# Patient Record
Sex: Male | Born: 1970 | Race: White | Hispanic: No | Marital: Married | Smoking: Never smoker
Health system: Southern US, Community
[De-identification: ages and names within clinical notes are randomized; demographics above are authoritative.]

## PROBLEM LIST (undated history)

## (undated) DIAGNOSIS — M20019 Mallet finger of unspecified finger(s): Secondary | ICD-10-CM

## (undated) DIAGNOSIS — M25519 Pain in unspecified shoulder: Secondary | ICD-10-CM

## (undated) DIAGNOSIS — R197 Diarrhea, unspecified: Secondary | ICD-10-CM

## (undated) DIAGNOSIS — R748 Abnormal levels of other serum enzymes: Secondary | ICD-10-CM

## (undated) DIAGNOSIS — R05 Cough: Secondary | ICD-10-CM

## (undated) DIAGNOSIS — M25529 Pain in unspecified elbow: Secondary | ICD-10-CM

## (undated) DIAGNOSIS — F1010 Alcohol abuse, uncomplicated: Secondary | ICD-10-CM

## (undated) DIAGNOSIS — R61 Generalized hyperhidrosis: Secondary | ICD-10-CM

## (undated) DIAGNOSIS — M7710 Lateral epicondylitis, unspecified elbow: Secondary | ICD-10-CM

## (undated) DIAGNOSIS — R109 Unspecified abdominal pain: Secondary | ICD-10-CM

## (undated) DIAGNOSIS — S83207A Unspecified tear of unspecified meniscus, current injury, left knee, initial encounter: Secondary | ICD-10-CM

## (undated) DIAGNOSIS — M25562 Pain in left knee: Secondary | ICD-10-CM

## (undated) DIAGNOSIS — K219 Gastro-esophageal reflux disease without esophagitis: Secondary | ICD-10-CM

## (undated) DIAGNOSIS — R079 Chest pain, unspecified: Secondary | ICD-10-CM

## (undated) DIAGNOSIS — J069 Acute upper respiratory infection, unspecified: Secondary | ICD-10-CM

## (undated) DIAGNOSIS — R059 Cough, unspecified: Secondary | ICD-10-CM

## (undated) DIAGNOSIS — M79662 Pain in left lower leg: Secondary | ICD-10-CM

## (undated) DIAGNOSIS — J329 Chronic sinusitis, unspecified: Secondary | ICD-10-CM

## (undated) HISTORY — DX: Unspecified tear of unspecified meniscus, current injury, left knee, initial encounter: S83.207A

## (undated) HISTORY — PX: APPENDECTOMY: SHX54

## (undated) HISTORY — DX: Displaced fracture of distal phalanx of unspecified finger, initial encounter for closed fracture: M20.019

## (undated) HISTORY — DX: Gastro-esophageal reflux disease without esophagitis: K21.9

## (undated) HISTORY — DX: Diarrhea, unspecified: R19.7

## (undated) HISTORY — DX: Abnormal levels of other serum enzymes: R74.8

## (undated) HISTORY — DX: Cough, unspecified: R05.9

## (undated) HISTORY — DX: Lateral epicondylitis, unspecified elbow: M77.10

## (undated) HISTORY — DX: Pain in left lower leg: M79.662

## (undated) HISTORY — DX: Pain in unspecified elbow: M25.529

## (undated) HISTORY — DX: Acute upper respiratory infection, unspecified: J06.9

## (undated) HISTORY — DX: Chest pain, unspecified: R07.9

## (undated) HISTORY — DX: Chronic sinusitis, unspecified: J32.9

## (undated) HISTORY — PX: KNEE SURGERY: SHX244

## (undated) HISTORY — PX: PILONIDAL CYST DRAINAGE: SHX743

## (undated) HISTORY — DX: Unspecified abdominal pain: R10.9

## (undated) HISTORY — DX: Pain in unspecified shoulder: M25.519

## (undated) HISTORY — DX: Generalized hyperhidrosis: R61

## (undated) HISTORY — DX: Pain in left knee: M25.562

---
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
## 2009-01-04 ENCOUNTER — Inpatient Hospital Stay (HOSPITAL_COMMUNITY): Admission: EM | Admit: 2009-01-04 | Discharge: 2009-01-10 | Payer: Self-pay | Admitting: Emergency Medicine

## 2009-01-04 ENCOUNTER — Encounter (INDEPENDENT_AMBULATORY_CARE_PROVIDER_SITE_OTHER): Payer: Self-pay | Admitting: General Surgery

## 2010-03-26 LAB — CBC
HCT: 32 % — ABNORMAL LOW (ref 39.0–52.0)
Hemoglobin: 11.1 g/dL — ABNORMAL LOW (ref 13.0–17.0)
MCHC: 34.6 g/dL (ref 30.0–36.0)
MCHC: 34.8 g/dL (ref 30.0–36.0)
MCHC: 35.2 g/dL (ref 30.0–36.0)
MCV: 96 fL (ref 78.0–100.0)
Platelets: 223 10*3/uL (ref 150–400)
RBC: 3.28 MIL/uL — ABNORMAL LOW (ref 4.22–5.81)
RBC: 3.3 MIL/uL — ABNORMAL LOW (ref 4.22–5.81)
RDW: 12.7 % (ref 11.5–15.5)
WBC: 8 10*3/uL (ref 4.0–10.5)

## 2010-03-26 LAB — BASIC METABOLIC PANEL
BUN: 8 mg/dL (ref 6–23)
CO2: 27 mEq/L (ref 19–32)
Calcium: 8.4 mg/dL (ref 8.4–10.5)
Calcium: 8.7 mg/dL (ref 8.4–10.5)
Chloride: 97 mEq/L (ref 96–112)
Creatinine, Ser: 0.82 mg/dL (ref 0.4–1.5)
GFR calc non Af Amer: >60 mL/min (ref >60)
GFR calc non Af Amer: >60 mL/min (ref >60)
GFR calc non Af Amer: >60 mL/min (ref >60)
Glucose, Bld: 103 mg/dL — ABNORMAL HIGH (ref 70–99)
Glucose, Bld: 115 mg/dL — ABNORMAL HIGH (ref 70–99)
Glucose, Bld: 116 mg/dL — ABNORMAL HIGH (ref 70–99)
Potassium: 3.1 mEq/L — ABNORMAL LOW (ref 3.5–5.1)
Potassium: 3.3 mEq/L — ABNORMAL LOW (ref 3.5–5.1)
Sodium: 135 mEq/L (ref 135–145)
Sodium: 136 mEq/L (ref 135–145)

## 2010-04-10 LAB — CBC
HCT: 34.9 % — ABNORMAL LOW (ref 39.0–52.0)
HCT: 37.1 % — ABNORMAL LOW (ref 39.0–52.0)
Hemoglobin: 12.3 g/dL — ABNORMAL LOW (ref 13.0–17.0)
Hemoglobin: 13.4 g/dL (ref 13.0–17.0)
MCHC: 34.3 g/dL (ref 30.0–36.0)
MCHC: 35.3 g/dL (ref 30.0–36.0)
MCV: 96.6 fL (ref 78.0–100.0)
MCV: 96.8 fL (ref 78.0–100.0)
MCV: 97.4 fL (ref 78.0–100.0)
Platelets: 179 10*3/uL (ref 150–400)
Platelets: 198 10*3/uL (ref 150–400)
RBC: 3.61 MIL/uL — ABNORMAL LOW (ref 4.22–5.81)
RBC: 3.93 MIL/uL — ABNORMAL LOW (ref 4.22–5.81)
WBC: 10.9 10*3/uL — ABNORMAL HIGH (ref 4.0–10.5)
WBC: 11.8 10*3/uL — ABNORMAL HIGH (ref 4.0–10.5)

## 2010-04-10 LAB — BASIC METABOLIC PANEL
BUN: 10 mg/dL (ref 6–23)
CO2: 29 mEq/L (ref 19–32)
CO2: 29 mEq/L (ref 19–32)
Calcium: 8.3 mg/dL — ABNORMAL LOW (ref 8.4–10.5)
Chloride: 96 mEq/L (ref 96–112)
Chloride: 98 mEq/L (ref 96–112)
Creatinine, Ser: 0.81 mg/dL (ref 0.4–1.5)
Creatinine, Ser: 0.81 mg/dL (ref 0.4–1.5)
Creatinine, Ser: 0.86 mg/dL (ref 0.4–1.5)
Glucose, Bld: 106 mg/dL — ABNORMAL HIGH (ref 70–99)
Potassium: 3.9 mEq/L (ref 3.5–5.1)

## 2010-04-10 LAB — COMPREHENSIVE METABOLIC PANEL
BUN: 10 mg/dL (ref 6–23)
Calcium: 9.1 mg/dL (ref 8.4–10.5)
Glucose, Bld: 97 mg/dL (ref 70–99)
Total Protein: 7.1 g/dL (ref 6.0–8.3)

## 2010-04-10 LAB — URINALYSIS, ROUTINE W REFLEX MICROSCOPIC
Ketones, ur: NEGATIVE mg/dL
Nitrite: NEGATIVE
Specific Gravity, Urine: 1.017 (ref 1.005–1.030)
pH: 6 (ref 5.0–8.0)

## 2010-04-10 LAB — DIFFERENTIAL
Lymphocytes Relative: 11 % — ABNORMAL LOW (ref 12–46)
Lymphs Abs: 1.2 10*3/uL (ref 0.7–4.0)
Neutro Abs: 9.1 10*3/uL — ABNORMAL HIGH (ref 1.7–7.7)
Neutrophils Relative %: 83 % — ABNORMAL HIGH (ref 43–77)

## 2010-04-10 LAB — LIPASE, BLOOD: Lipase: 20 U/L (ref 11–59)

## 2017-02-05 ENCOUNTER — Encounter (HOSPITAL_COMMUNITY): Payer: Self-pay

## 2017-02-05 ENCOUNTER — Other Ambulatory Visit: Payer: Self-pay

## 2017-02-05 ENCOUNTER — Emergency Department (HOSPITAL_COMMUNITY)
Admission: EM | Admit: 2017-02-05 | Discharge: 2017-02-05 | Disposition: A | Discharge status: Home / Self Care | Payer: Self-pay | Attending: Emergency Medicine | Admitting: Emergency Medicine

## 2017-02-05 DIAGNOSIS — F10930 Alcohol use, unspecified with withdrawal, uncomplicated: Secondary | ICD-10-CM

## 2017-02-05 DIAGNOSIS — F10230 Alcohol dependence with withdrawal, uncomplicated: Secondary | ICD-10-CM | POA: Insufficient documentation

## 2017-02-05 DIAGNOSIS — F17290 Nicotine dependence, other tobacco product, uncomplicated: Secondary | ICD-10-CM | POA: Insufficient documentation

## 2017-02-05 HISTORY — DX: Alcohol abuse, uncomplicated: F10.10

## 2017-02-05 MED ORDER — CHLORDIAZEPOXIDE HCL 25 MG PO CAPS: ORAL_CAPSULE | ORAL | 0 refills | Status: DC

## 2017-02-05 MED ORDER — LORAZEPAM 1 MG PO TABS
2.0000 mg | ORAL_TABLET | Freq: Once | ORAL | Status: AC
  Administered 2017-02-05: 2 mg via ORAL
  Filled 2017-02-05: qty 2

## 2017-02-05 NOTE — ED Triage Notes (Signed)
Patient reports that he has not drank any alcohol in 14 hours. Patient states that he drinks 12 beers daily. Patient also c/o tremors and diaphoresis.

## 2017-02-05 NOTE — ED Provider Notes (Signed)
[facility]-EMERGENCY DEPT Provider Note   CSN: [id number] Arrival date & time: 02/05/17  1336     History   Chief Complaint Chief Complaint  Patient presents with  . detox alcohol    HPI Robert Norton is a 47 y.o. male.  47 y/o male w/ request for etoh detox  Last drink 16 hrs ago patient states that he normally drinks about 12 beers a day.  Denies any illicit drug use.  No suicidal or homicidal ideations.  Has had some tremors but denies any seizure activity.  No hallucinations.  Denies any abdominal pain or vomiting.  No treatment used prior to arrival.      Past Medical History:  Diagnosis Date  . ETOH abuse     There are no active problems to display for this patient.   Past Surgical History:  Procedure Laterality Date  . APPENDECTOMY    . KNEE SURGERY    . PILONIDAL CYST DRAINAGE         Home Medications    Prior to Admission medications   Not on File    Family History History reviewed. No pertinent family history.  Social History Social History   Tobacco Use  . Smoking status: Never Smoker  . Smokeless tobacco: Current User    Types: Snuff  Substance Use Topics  . Alcohol use: Yes    Alcohol/week: 7.2 oz    Types: 12 Cans of beer per week    Comment: daily  . Drug use: No     Allergies   Penicillins   Review of Systems Review of Systems  All other systems reviewed and are negative.    Physical Exam Updated Vital Signs BP (!) 134/108 (BP Location: Left Arm)   Pulse 89   Temp 99.1 F (37.3 C) (Oral)   Resp 20   Ht 1.88 m (6\' 2" )   Wt 89.4 kg (197 lb)   SpO2 98%   BMI 25.29 kg/m   Physical Exam  Constitutional: He is oriented to person, place, and time. He appears well-developed and well-nourished.  Non-toxic appearance. No distress.  HENT:  Head: Normocephalic and atraumatic.  Eyes: Conjunctivae, EOM and lids are normal. Pupils are equal, round, and reactive to light.  Neck: Normal range of  motion. Neck supple. No tracheal deviation present. No thyroid mass present.  Cardiovascular: Normal rate, regular rhythm and normal heart sounds. Exam reveals no gallop.  No murmur heard. Pulmonary/Chest: Effort normal and breath sounds normal. No stridor. No respiratory distress. He has no decreased breath sounds. He has no wheezes. He has no rhonchi. He has no rales.  Abdominal: Soft. Normal appearance and bowel sounds are normal. He exhibits no distension. There is no tenderness. There is no rebound and no CVA tenderness.  Musculoskeletal: Normal range of motion. He exhibits no edema or tenderness.  Neurological: He is alert and oriented to person, place, and time. He has normal strength. No cranial nerve deficit or sensory deficit. GCS eye subscore is 4. GCS verbal subscore is 5. GCS motor subscore is 6.  Skin: Skin is warm and dry. No abrasion and no rash noted.  Psychiatric: He has a normal mood and affect. His speech is normal and behavior is normal. He expresses no suicidal plans and no homicidal plans.  Nursing note and vitals reviewed.    ED Treatments / Results  Labs (all labs ordered are listed, but only abnormal results are displayed) Labs Reviewed - No data to display  EKG  EKG Interpretation None       Radiology No results found.  Procedures Procedures (including critical care time)  Medications Ordered in ED Medications  LORazepam (ATIVAN) tablet 2 mg (not administered)     Initial Impression / Assessment and Plan / ED Course  I have reviewed the triage vital signs and the nursing notes.  Pertinent labs & imaging results that were available during my care of the patient were reviewed by me and considered in my medical decision making (see chart for details).    Patient with symptoms of mild alcohol withdrawal.  Medicated with Ativan 2 mg p.o. here.  Will give Librium taper as well as outpatient resources.  Final Clinical Impressions(s) / ED Diagnoses    Final diagnoses:  None    ED Discharge Orders    None       Lorre Nick, MD 02/05/17 1406

## 2017-02-21 ENCOUNTER — Encounter (HOSPITAL_COMMUNITY): Payer: Self-pay

## 2017-02-21 ENCOUNTER — Emergency Department (HOSPITAL_COMMUNITY)
Admission: EM | Admit: 2017-02-21 | Discharge: 2017-02-21 | Disposition: A | Discharge status: Home / Self Care | Payer: Self-pay | Attending: Emergency Medicine | Admitting: Emergency Medicine

## 2017-02-21 DIAGNOSIS — F10930 Alcohol use, unspecified with withdrawal, uncomplicated: Secondary | ICD-10-CM

## 2017-02-21 DIAGNOSIS — K7010 Alcoholic hepatitis without ascites: Secondary | ICD-10-CM | POA: Insufficient documentation

## 2017-02-21 DIAGNOSIS — F10230 Alcohol dependence with withdrawal, uncomplicated: Secondary | ICD-10-CM | POA: Insufficient documentation

## 2017-02-21 DIAGNOSIS — F17290 Nicotine dependence, other tobacco product, uncomplicated: Secondary | ICD-10-CM | POA: Insufficient documentation

## 2017-02-21 LAB — CBC
HCT: 44.9 % (ref 39.0–52.0)
HEMOGLOBIN: 16.1 g/dL (ref 13.0–17.0)
MCH: 36 pg — AB (ref 26.0–34.0)
MCHC: 35.9 g/dL (ref 30.0–36.0)
MCV: 100.4 fL — ABNORMAL HIGH (ref 78.0–100.0)
PLATELETS: 194 10*3/uL (ref 150–400)
RBC: 4.47 MIL/uL (ref 4.22–5.81)
RDW: 12.8 % (ref 11.5–15.5)
WBC: 6.5 10*3/uL (ref 4.0–10.5)

## 2017-02-21 LAB — COMPREHENSIVE METABOLIC PANEL
ALBUMIN: 4.5 g/dL (ref 3.5–5.0)
ALK PHOS: 49 U/L (ref 38–126)
ALT: 42 U/L (ref 17–63)
ANION GAP: 17 — AB (ref 5–15)
AST: 57 U/L — ABNORMAL HIGH (ref 15–41)
BILIRUBIN TOTAL: 2.8 mg/dL — AB (ref 0.3–1.2)
BUN: 12 mg/dL (ref 6–20)
CALCIUM: 9.3 mg/dL (ref 8.9–10.3)
CO2: 23 mmol/L (ref 22–32)
Chloride: 98 mmol/L — ABNORMAL LOW (ref 101–111)
Creatinine, Ser: 1 mg/dL (ref 0.61–1.24)
GFR calc Af Amer: >60 mL/min (ref >60)
GLUCOSE: 102 mg/dL — AB (ref 65–99)
POTASSIUM: 4.3 mmol/L (ref 3.5–5.1)
Sodium: 138 mmol/L (ref 135–145)
TOTAL PROTEIN: 7 g/dL (ref 6.5–8.1)

## 2017-02-21 LAB — RAPID URINE DRUG SCREEN, HOSP PERFORMED
Amphetamines: NOT DETECTED
Barbiturates: NOT DETECTED
Benzodiazepines: POSITIVE — AB
COCAINE: NOT DETECTED
OPIATES: NOT DETECTED
Tetrahydrocannabinol: NOT DETECTED

## 2017-02-21 LAB — ETHANOL

## 2017-02-21 MED ORDER — ADULT MULTIVITAMIN W/MINERALS CH: 1.0000 | ORAL_TABLET | Freq: Every day | ORAL | Status: DC

## 2017-02-21 MED ORDER — ONDANSETRON 4 MG PO TBDP: 4.0000 mg | ORAL_TABLET | Freq: Four times a day (QID) | ORAL | Status: DC | PRN

## 2017-02-21 MED ORDER — CHLORDIAZEPOXIDE HCL 25 MG PO CAPS: ORAL_CAPSULE | ORAL | 0 refills | Status: DC

## 2017-02-21 MED ORDER — VITAMIN B-1 100 MG PO TABS: 100.0000 mg | ORAL_TABLET | Freq: Every day | ORAL | Status: DC

## 2017-02-21 MED ORDER — LOPERAMIDE HCL 2 MG PO CAPS: 2.0000 mg | ORAL_CAPSULE | ORAL | Status: DC | PRN

## 2017-02-21 MED ORDER — CHLORDIAZEPOXIDE HCL 25 MG PO CAPS
25.0000 mg | ORAL_CAPSULE | Freq: Four times a day (QID) | ORAL | Status: DC | PRN
  Administered 2017-02-21: 25 mg via ORAL
  Filled 2017-02-21: qty 1

## 2017-02-21 MED ORDER — THIAMINE HCL 100 MG/ML IJ SOLN: 100.0000 mg | Freq: Once | INTRAMUSCULAR | Status: DC

## 2017-02-21 MED ORDER — HYDROXYZINE HCL 25 MG PO TABS: 25.0000 mg | ORAL_TABLET | Freq: Four times a day (QID) | ORAL | Status: DC | PRN

## 2017-02-21 NOTE — Discharge Instructions (Signed)
Avoid all forms of alcohol.  Your alcohol use has caused inflammation of your liver, which will improve, when you stop drinking alcohol.  Consider counseling, or treatment if needed for ongoing alcohol abuse.  May be also helpful to go to Alcoholics Anonymous

## 2017-02-21 NOTE — ED Notes (Signed)
Pt states he has to pick up his kids at 4 and must leave dr Effie ShyWentz into see pt

## 2017-02-21 NOTE — ED Provider Notes (Signed)
[facility] Provider Note   CSN: [id number] Arrival date & time: 02/21/17  [id number]     History   Chief Complaint Chief Complaint  Patient presents with  . ETOH withdrawal/tremors    HPI Robert Norton is a 47 y.o. male.  He is here because of tremulousness, associated with alcohol avoidance in the last 24 hours.  Similar problems 2 weeks ago evaluated and treated at the ED, with short-term Librium taper, with success.  Unfortunately he resumed drinking after that.  He is committed to avoidance of alcohol, because of going through a divorce, and planning to have his children with him this weekend.  He denies headache, chest pain, weakness or dizziness.  There are no other known modifying factors.  HPI  Past Medical History:  Diagnosis Date  . ETOH abuse     There are no active problems to display for this patient.   Past Surgical History:  Procedure Laterality Date  . APPENDECTOMY    . KNEE SURGERY    . PILONIDAL CYST DRAINAGE         Home Medications    Prior to Admission medications   Medication Sig Start Date End Date Taking? Authorizing Provider  chlordiazePOXIDE (LIBRIUM) 25 MG capsule 50mg  PO TID x 1D, then 25-50mg  PO BID X 1D, then 25-50mg  PO QD prn 02/21/17   Mancel BaleWentz, Serena Petterson, MD    Family History Family History  Problem Relation Age of Onset  . Diabetes Mother     Social History Social History   Tobacco Use  . Smoking status: Never Smoker  . Smokeless tobacco: Current User    Types: Snuff  Substance Use Topics  . Alcohol use: Yes    Alcohol/week: 7.2 oz    Types: 12 Cans of beer per week    Comment: daily  . Drug use: No     Allergies   Penicillins   Review of Systems Review of Systems  All other systems reviewed and are negative.    Physical Exam Updated Vital Signs BP (!) 155/90 (BP Location: Left Arm)   Pulse 73   Temp 98.2 F (36.8 C) (Oral)   Resp 16   SpO2 98%   Physical Exam    Constitutional: He is oriented to person, place, and time. He appears well-developed and well-nourished. No distress.  HENT:  Head: Normocephalic and atraumatic.  Right Ear: External ear normal.  Left Ear: External ear normal.  Eyes: Conjunctivae and EOM are normal. Pupils are equal, round, and reactive to light.  Neck: Normal range of motion and phonation normal. Neck supple.  Cardiovascular: Normal rate, regular rhythm and normal heart sounds.  Pulmonary/Chest: Effort normal and breath sounds normal. He exhibits no bony tenderness.  Abdominal: Soft. There is no tenderness.  Musculoskeletal: Normal range of motion.  Neurological: He is alert and oriented to person, place, and time. No cranial nerve deficit or sensory deficit. He exhibits normal muscle tone. Coordination normal.  No dysarthria or aphasia.  Normal gait.  Skin: Skin is warm, dry and intact.  Psychiatric: He has a normal mood and affect. His behavior is normal. Judgment and thought content normal.  Nursing note and vitals reviewed.    ED Treatments / Results  Labs (all labs ordered are listed, but only abnormal results are displayed) Labs Reviewed  COMPREHENSIVE METABOLIC PANEL - Abnormal; Notable for the following components:      Result Value   Chloride 98 (*)    Glucose, Bld  102 (*)    AST 57 (*)    Total Bilirubin 2.8 (*)    Anion gap 17 (*)    All other components within normal limits  CBC - Abnormal; Notable for the following components:   MCV 100.4 (*)    MCH 36.0 (*)    All other components within normal limits  RAPID URINE DRUG SCREEN, HOSP PERFORMED - Abnormal; Notable for the following components:   Benzodiazepines POSITIVE (*)    All other components within normal limits  ETHANOL    EKG  EKG Interpretation None       Radiology No results found.  Procedures Procedures (including critical care time)  Medications Ordered in ED Medications  thiamine (B-1) injection 100 mg (not  administered)  thiamine (VITAMIN B-1) tablet 100 mg (not administered)  multivitamin with minerals tablet 1 tablet (not administered)  chlordiazePOXIDE (LIBRIUM) capsule 25 mg (25 mg Oral Given 02/21/17 1506)  hydrOXYzine (ATARAX/VISTARIL) tablet 25 mg (not administered)  loperamide (IMODIUM) capsule 2-4 mg (not administered)  ondansetron (ZOFRAN-ODT) disintegrating tablet 4 mg (not administered)     Initial Impression / Assessment and Plan / ED Course  I have reviewed the triage vital signs and the nursing notes.  Pertinent labs & imaging results that were available during my care of the patient were reviewed by me and considered in my medical decision making (see chart for details).      Patient Vitals for the past 24 hrs:  BP Temp Temp src Pulse Resp SpO2  02/21/17 1200 (!) 155/90 98.2 F (36.8 C) Oral 73 16 98 %  02/21/17 0955 (!) 163/110 98 F (36.7 C) Oral 76 16 100 %    3:07 PM Reevaluation with update and discussion. After initial assessment and treatment, an updated evaluation reveals clinical exam unchanged, he is calm and comfortable.  Findings discussed and questions answered. Mancel Bale      Final Clinical Impressions(s) / ED Diagnoses   Final diagnoses:  Alcohol withdrawal syndrome without complication (HCC)  Alcoholic hepatitis without ascites   Ongoing alcohol abuse with alcohol withdrawal symptoms.  Patient stable foot outpatient management.  Mild blood pressure elevation likely related to alcohol withdrawal.  Screening labs, urine drug screen, metabolic panel and CBC are reassuring.  Mild elevation of T bilirubin, likely related to alcohol associated hepatitis.  Nonspecific anion gap elevation at 17.  Nursing Notes Reviewed/ Care Coordinated Applicable Imaging Reviewed Interpretation of Laboratory Data incorporated into ED treatment  The patient appears reasonably screened and/or stabilized for discharge and I doubt any other medical condition or other  [facility] requiring further screening, evaluation, or treatment in the ED at this time prior to discharge.  Plan: Home Medications-DC analgesia.; Home Treatments-rest, fluids, avoid alcohol; return here if the recommended treatment, does not improve the symptoms; Recommended follow up-outpatient follow-up with alcohol treatment facility and/or support service as needed   ED Discharge Orders        Ordered    chlordiazePOXIDE (LIBRIUM) 25 MG capsule     02/21/17 1500       Mancel Bale, MD 02/21/17 [phone]

## 2017-02-21 NOTE — ED Triage Notes (Signed)
Patient complains of tremors and restlessness since last evening. States that he is going through a divorce and has been drinking daily x 2-3 weeks, last use last night 6pm. Tremors noted on arrival, no GI symptoms. Alert and oriented, denies drug use

## 2017-02-21 NOTE — ED Notes (Addendum)
Pt given gingerale and paces back and forth by bed

## 2017-02-21 NOTE — ED Notes (Signed)
Pt states he has been going thru divorce that for 3 weeks in row he has had something to drink etoh wise everyday, may have been just a beer or 2 somethimes more , last night he had bad sweats , tremors that cont and  Has had  Anxiety out the roof, states he gets his kids this weekend and knew he had to do something

## 2018-04-02 ENCOUNTER — Encounter (HOSPITAL_COMMUNITY): Payer: Self-pay | Admitting: Emergency Medicine

## 2018-04-02 ENCOUNTER — Emergency Department (HOSPITAL_COMMUNITY)
Admission: EM | Admit: 2018-04-02 | Discharge: 2018-04-02 | Disposition: A | Discharge status: Home / Self Care | Payer: BLUE CROSS/BLUE SHIELD | Attending: Emergency Medicine | Admitting: Emergency Medicine

## 2018-04-02 ENCOUNTER — Emergency Department (HOSPITAL_COMMUNITY): Payer: BLUE CROSS/BLUE SHIELD

## 2018-04-02 DIAGNOSIS — R404 Transient alteration of awareness: Secondary | ICD-10-CM | POA: Diagnosis not present

## 2018-04-02 DIAGNOSIS — G40909 Epilepsy, unspecified, not intractable, without status epilepticus: Secondary | ICD-10-CM | POA: Diagnosis not present

## 2018-04-02 DIAGNOSIS — R569 Unspecified convulsions: Secondary | ICD-10-CM

## 2018-04-02 DIAGNOSIS — R0689 Other abnormalities of breathing: Secondary | ICD-10-CM | POA: Diagnosis not present

## 2018-04-02 DIAGNOSIS — R064 Hyperventilation: Secondary | ICD-10-CM | POA: Diagnosis not present

## 2018-04-02 DIAGNOSIS — R55 Syncope and collapse: Secondary | ICD-10-CM

## 2018-04-02 LAB — COMPREHENSIVE METABOLIC PANEL
ALT: 171 U/L — ABNORMAL HIGH (ref 0–44)
ANION GAP: 20 — AB (ref 5–15)
AST: 192 U/L — ABNORMAL HIGH (ref 15–41)
Albumin: 4.5 g/dL (ref 3.5–5.0)
Alkaline Phosphatase: 49 U/L (ref 38–126)
BUN: 10 mg/dL (ref 6–20)
CO2: 17 mmol/L — AB (ref 22–32)
Calcium: 9.4 mg/dL (ref 8.9–10.3)
Chloride: 98 mmol/L (ref 98–111)
Creatinine, Ser: 0.95 mg/dL (ref 0.61–1.24)
GFR calc non Af Amer: >60 mL/min (ref >60)
GLUCOSE: 129 mg/dL — AB (ref 70–99)
POTASSIUM: 3.8 mmol/L (ref 3.5–5.1)
Sodium: 135 mmol/L (ref 135–145)
TOTAL PROTEIN: 7.3 g/dL (ref 6.5–8.1)
Total Bilirubin: 1.3 mg/dL — ABNORMAL HIGH (ref 0.3–1.2)

## 2018-04-02 LAB — CBC WITH DIFFERENTIAL/PLATELET
Abs Immature Granulocytes: 0.06 10*3/uL (ref 0.00–0.07)
BASOS ABS: 0 10*3/uL (ref 0.0–0.1)
Basophils Relative: 1 %
EOS ABS: 0.1 10*3/uL (ref 0.0–0.5)
Eosinophils Relative: 1 %
HEMATOCRIT: 44 % (ref 39.0–52.0)
Hemoglobin: 14.8 g/dL (ref 13.0–17.0)
IMMATURE GRANULOCYTES: 1 %
LYMPHS ABS: 1.7 10*3/uL (ref 0.7–4.0)
LYMPHS PCT: 21 %
MCH: 34.4 pg — ABNORMAL HIGH (ref 26.0–34.0)
MCHC: 33.6 g/dL (ref 30.0–36.0)
MCV: 102.3 fL — ABNORMAL HIGH (ref 80.0–100.0)
Monocytes Absolute: 0.8 10*3/uL (ref 0.1–1.0)
Monocytes Relative: 10 %
NEUTROS PCT: 66 %
NRBC: 0 % (ref 0.0–0.2)
Neutro Abs: 5.3 10*3/uL (ref 1.7–7.7)
Platelets: 155 10*3/uL (ref 150–400)
RBC: 4.3 MIL/uL (ref 4.22–5.81)
RDW: 12.1 % (ref 11.5–15.5)
WBC: 7.9 10*3/uL (ref 4.0–10.5)

## 2018-04-02 LAB — RAPID URINE DRUG SCREEN, HOSP PERFORMED
Amphetamines: NOT DETECTED
BARBITURATES: NOT DETECTED
BENZODIAZEPINES: NOT DETECTED
COCAINE: NOT DETECTED
Opiates: NOT DETECTED
Tetrahydrocannabinol: NOT DETECTED

## 2018-04-02 LAB — ETHANOL: Alcohol, Ethyl (B): <10 mg/dL (ref <10)

## 2018-04-02 NOTE — ED Provider Notes (Signed)
[facility] EMERGENCY DEPARTMENT Provider Note   CSN: [id number] Arrival date & time: 04/02/18  1144    History   Chief Complaint Chief Complaint  Patient presents with  . Seizures    HPI Robert Norton is a 48 y.o. male.    Level 5 caveat due to altered mental status. HPI Patient presents after a seizure.  Patient was reportedly out on a walk with his girlfriend.  Lay down and had reported seizure.  Has not previously had a seizure history.  Had however been a heavy drinker.  States he no longer drinks as heavily but will have a couple drinks about every other day.  No longer drinking until he gets drunk.  No headache.  Still confused to what happened and patient states his tongue hurts from where he bit it.  Patient was diaphoretic and hypertensive initially. Past Medical History:  Diagnosis Date  . ETOH abuse     There are no active problems to display for this patient.   Past Surgical History:  Procedure Laterality Date  . APPENDECTOMY    . KNEE SURGERY    . PILONIDAL CYST DRAINAGE          Home Medications    Prior to Admission medications   Medication Sig Start Date End Date Taking? Authorizing Provider  chlordiazePOXIDE (LIBRIUM) 25 MG capsule 50mg  PO TID x 1D, then 25-50mg  PO BID X 1D, then 25-50mg  PO QD prn 02/21/17   Mancel Bale, MD    Family History Family History  Problem Relation Age of Onset  . Diabetes Mother     Social History Social History   Tobacco Use  . Smoking status: Never Smoker  . Smokeless tobacco: Current User    Types: Snuff  Substance Use Topics  . Alcohol use: Yes    Comment: former alcoholic; drinks occ  . Drug use: No     Allergies   Penicillins   Review of Systems Review of Systems  Cardiovascular: Negative for chest pain.  Neurological: Positive for seizures.  Psychiatric/Behavioral: Positive for confusion.     Physical Exam Updated Vital Signs BP 136/86   Pulse 79   Temp 97.7 F  (36.5 C) (Oral)   Resp 17   Ht 6\' 3"  (1.905 m)   Wt 86.2 kg   SpO2 99%   BMI 23.75 kg/m   Physical Exam Constitutional:      Appearance: Normal appearance. He is diaphoretic.  HENT:     Mouth/Throat:     Comments: Patient has bites on left side of tongue. Eyes:     Pupils: Pupils are equal, round, and reactive to light.  Neck:     Musculoskeletal: Neck supple.  Cardiovascular:     Rate and Rhythm: Regular rhythm.  Pulmonary:     Effort: Pulmonary effort is normal.  Chest:     Chest wall: No tenderness.  Abdominal:     Tenderness: There is no abdominal tenderness.  Musculoskeletal:     Right lower leg: No edema.     Left lower leg: No edema.  Skin:    General: Skin is warm.     Capillary Refill: Capillary refill takes less than 2 seconds.  Neurological:     Cranial Nerves: No cranial nerve deficit.     Comments: Patient is awake and alert but still somewhat confused to events and somewhat to what is been happening recently.      ED Treatments / Results  Labs (all labs  ordered are listed, but only abnormal results are displayed) Labs Reviewed  COMPREHENSIVE METABOLIC PANEL - Abnormal; Notable for the following components:      Result Value   CO2 17 (*)    Glucose, Bld 129 (*)    AST 192 (*)    ALT 171 (*)    Total Bilirubin 1.3 (*)    Anion gap 20 (*)    All other components within normal limits  CBC WITH DIFFERENTIAL/PLATELET - Abnormal; Notable for the following components:   MCV 102.3 (*)    MCH 34.4 (*)    All other components within normal limits  ETHANOL  RAPID URINE DRUG SCREEN, HOSP PERFORMED    EKG EKG Interpretation  Date/Time:  Wednesday April 02 2018 11:52:03 EDT Ventricular Rate:  92 PR Interval:    QRS Duration: 79 QT Interval:  330 QTC Calculation: 409 R Axis:   50 Text Interpretation:  Sinus rhythm Confirmed by Benjiman Core [phone]) on 04/02/2018 1:13:35 PM   Radiology Ct Head Wo Contrast  Result Date: 04/02/2018 CLINICAL  DATA:  Witnessed seizure. EXAM: CT HEAD WITHOUT CONTRAST TECHNIQUE: Contiguous axial images were obtained from the base of the skull through the vertex without intravenous contrast. COMPARISON:  None. FINDINGS: Brain: No evidence of acute infarction, hemorrhage, hydrocephalus, extra-axial collection or mass lesion/mass effect. Vascular: No hyperdense vessel or unexpected calcification. Skull: Normal. Negative for fracture or focal lesion. Sinuses/Orbits: No acute finding. Other: None. IMPRESSION: No acute intracranial abnormality. Electronically Signed   By: Ted Mcalpine M.D.   On: 04/02/2018 13:31    Procedures Procedures (including critical care time)  Medications Ordered in ED Medications - No data to display   Initial Impression / Assessment and Plan / ED Course  I have reviewed the triage vital signs and the nursing notes.  Pertinent labs & imaging results that were available during my care of the patient were reviewed by me and considered in my medical decision making (see chart for details).         patient presented after syncopal episode.  Patient is now awake and baseline.  Reportedly had been for a run and was halfway through a 5 mile run when he began to see stars and felt dizzy.  States he had a problem with his sugars dropping in the past.  Pain from biting his tongue.  Head CT reassuring.  However it is somewhat worrisome that it was not exertional syncope/seizure.  Had been a former heavy drinker but now only had a couple beers last night which is not unusual but does not drink heavily anymore.  Will have follow-up with both neurology and cardiology.  Patient states he is good friends with Dr. Excell Seltzer.  Final Clinical Impressions(s) / ED Diagnoses   Final diagnoses:  Seizure (HCC)  Syncope, unspecified syncope type    ED Discharge Orders    None       Benjiman Core, MD 04/02/18 1455

## 2018-04-02 NOTE — ED Notes (Signed)
Pt's Robert Norton Ph [phone]. Pls contact for updates and D/C

## 2018-04-02 NOTE — ED Triage Notes (Signed)
Pt was on a walk with gf, didn't feel well. Pt layed down outside and proceeded to have a seizure. Full body shaking. Unknown amount of time. Pt has no hx of seizure. BP 250/120 initially from fire. BP 177/90 from EMS. HR 130's. EMS reports pt was diaphoretic, light headed. Pt told EMS during transport his vision became blurry, pt hyperventilating. Upon arrival to ED, pt denies blurry vision. EMS reports pt has been post ictal during transport- repetitive questioning, unable to tell his bday. Pt alert and oriented on arrival.

## 2018-04-22 DIAGNOSIS — M7542 Impingement syndrome of left shoulder: Secondary | ICD-10-CM | POA: Diagnosis not present

## 2018-04-22 DIAGNOSIS — M25512 Pain in left shoulder: Secondary | ICD-10-CM | POA: Diagnosis not present

## 2018-05-26 DIAGNOSIS — M79662 Pain in left lower leg: Secondary | ICD-10-CM | POA: Diagnosis not present

## 2018-08-18 ENCOUNTER — Telehealth: Payer: Self-pay

## 2018-08-18 NOTE — Telephone Encounter (Signed)
Per Dr. Burt Knack, called to schedule patient for NP appointment. Left message to call back.   Will offer 8/17 at 1:20PM. Will confirm PCP and request records.

## 2018-08-19 NOTE — Telephone Encounter (Signed)
Left message to call back  

## 2018-08-21 NOTE — Telephone Encounter (Signed)
Dr. Burt Knack spoke with the patient and he has agreed to Monday at 1340. Appointment scheduled.

## 2018-08-25 ENCOUNTER — Ambulatory Visit: Payer: BC Managed Care – PPO | Admitting: Cardiovascular Disease

## 2018-10-24 ENCOUNTER — Other Ambulatory Visit: Payer: Self-pay

## 2018-10-24 DIAGNOSIS — Z20822 Contact with and (suspected) exposure to covid-19: Secondary | ICD-10-CM

## 2018-10-24 DIAGNOSIS — Z20828 Contact with and (suspected) exposure to other viral communicable diseases: Secondary | ICD-10-CM | POA: Diagnosis not present

## 2018-10-26 LAB — NOVEL CORONAVIRUS, NAA: SARS-CoV-2, NAA: NOT DETECTED

## 2018-12-23 DIAGNOSIS — R1110 Vomiting, unspecified: Secondary | ICD-10-CM | POA: Diagnosis not present

## 2018-12-23 DIAGNOSIS — Z7289 Other problems related to lifestyle: Secondary | ICD-10-CM | POA: Diagnosis not present

## 2018-12-23 DIAGNOSIS — R7989 Other specified abnormal findings of blood chemistry: Secondary | ICD-10-CM | POA: Diagnosis not present

## 2018-12-23 DIAGNOSIS — F430 Acute stress reaction: Secondary | ICD-10-CM | POA: Diagnosis not present

## 2019-04-17 ENCOUNTER — Telehealth: Payer: Self-pay

## 2019-04-17 NOTE — Telephone Encounter (Signed)
-----   Message from Holli Humbles sent at 01/12/2019 11:20 AM EST ----- Regarding: RE: needs appointment LM on VMail to call back to schedule :) ----- Message ----- From: [place], Nadea Kirkland E, CMA Sent: 01/08/2019   1:20 PM EST To: Holli Humbles Subject: FW: needs appointment                          Can you book this appointment and mail out NP information. Thank you. ----- Message ----- From: Iva Boop, MD Sent: 01/08/2019  12:56 PM EST To: Alec Mcphee E [place], CMA Subject: needs appointment                              Patient knows Janan Ridge - he has been asked to see GI MD for evaluation and he is wanting to see me   Please contact him (in Jan) and arrange an appointment  I dod not think it is urgent but if needed could use an 1130 or 350 slot

## 2019-06-14 DIAGNOSIS — F1020 Alcohol dependence, uncomplicated: Secondary | ICD-10-CM | POA: Diagnosis not present

## 2019-07-07 DIAGNOSIS — M79631 Pain in right forearm: Secondary | ICD-10-CM | POA: Diagnosis not present

## 2019-07-14 DIAGNOSIS — Z0100 Encounter for examination of eyes and vision without abnormal findings: Secondary | ICD-10-CM | POA: Diagnosis not present

## 2019-08-01 DIAGNOSIS — F1020 Alcohol dependence, uncomplicated: Secondary | ICD-10-CM | POA: Diagnosis not present

## 2019-09-02 DIAGNOSIS — Z114 Encounter for screening for human immunodeficiency virus [HIV]: Secondary | ICD-10-CM | POA: Diagnosis not present

## 2019-09-02 DIAGNOSIS — F1020 Alcohol dependence, uncomplicated: Secondary | ICD-10-CM | POA: Diagnosis not present

## 2019-09-02 DIAGNOSIS — Z1159 Encounter for screening for other viral diseases: Secondary | ICD-10-CM | POA: Diagnosis not present

## 2019-09-23 DIAGNOSIS — F1020 Alcohol dependence, uncomplicated: Secondary | ICD-10-CM | POA: Diagnosis not present

## 2019-09-30 DIAGNOSIS — G479 Sleep disorder, unspecified: Secondary | ICD-10-CM | POA: Diagnosis not present

## 2019-09-30 DIAGNOSIS — F1020 Alcohol dependence, uncomplicated: Secondary | ICD-10-CM | POA: Diagnosis not present

## 2019-10-12 DIAGNOSIS — F1020 Alcohol dependence, uncomplicated: Secondary | ICD-10-CM | POA: Diagnosis not present

## 2019-10-13 DIAGNOSIS — F1020 Alcohol dependence, uncomplicated: Secondary | ICD-10-CM | POA: Diagnosis not present

## 2019-10-16 ENCOUNTER — Other Ambulatory Visit: Payer: Self-pay

## 2019-10-16 ENCOUNTER — Ambulatory Visit (INDEPENDENT_AMBULATORY_CARE_PROVIDER_SITE_OTHER): Payer: BLUE CROSS/BLUE SHIELD | Admitting: Licensed Clinical Social Worker

## 2019-10-16 DIAGNOSIS — F1994 Other psychoactive substance use, unspecified with psychoactive substance-induced mood disorder: Secondary | ICD-10-CM | POA: Diagnosis not present

## 2019-10-16 DIAGNOSIS — F1020 Alcohol dependence, uncomplicated: Secondary | ICD-10-CM

## 2019-10-18 NOTE — Progress Notes (Signed)
Comprehensive Clinical Assessment (CCA) Note  10/16/2019 AADIT HAGOOD [id number]  Visit Diagnosis:      ICD-10-CM   1. Uncomplicated alcohol dependence ([place])  F10.20   2. Substance induced mood disorder (HCC)  F19.94       CCA Screening, Triage and Referral (STR)  Patient Reported Information How did you hear about [place]? Other (Comment)  Referral name: girlfriend  Referral phone number: No data recorded  Whom do you see for routine medical problems? Primary Care  Practice/Facility Name: [facility]  Practice/Facility Phone Number: No data recorded Name of Contact: No data recorded Contact Number: No data recorded Contact Fax Number: No data recorded Prescriber Name: No data recorded Prescriber Address (if known): No data recorded  What Is the Reason for Your Visit/Call Today? No data recorded How Long Has This Been Causing You Problems? > than 6 months  What Do You Feel Would Help You the Most Today? Assessment Only   Have You Recently Been in Any Inpatient Treatment (Hospital/Detox/Crisis Center/28-Day Program)? No  Name/Location of Program/Hospital:No data recorded How Long Were You There? No data recorded When Were You Discharged? No data recorded  Have You Ever Received Services From [facility] Before? No  Who Do You See at [facility]? No data recorded  Have You Recently Had Any Thoughts About Hurting Yourself? No  Are You Planning to Commit Suicide/Harm Yourself At This time? No   Have you Recently Had Thoughts About [place]? No  Explanation: No data recorded  Have You Used Any Alcohol or Drugs in the Past 24 Hours? No  How Long Ago Did You Use Drugs or Alcohol? No data recorded What Did You Use and How Much? No data recorded  Do You Currently Have a Therapist/Psychiatrist? No  Name of Therapist/Psychiatrist: No data recorded  Have You Been Recently Discharged From Any Office Practice or Programs?  No  Explanation of Discharge From Practice/Program: No data recorded    CCA Screening Triage Referral Assessment Type of Contact: Face-to-Face  Is this Initial or Reassessment? No data recorded Date Telepsych consult ordered in [facility]:  No data recorded Time Telepsych consult ordered in [facility]:  No data recorded  Patient Reported Information Reviewed? Yes  Patient Left Without Being Seen? No data recorded Reason for Not Completing Assessment: No data recorded  Collateral Involvement: EHR   Does Patient Have a [place]? No data recorded Name and Contact of Legal Guardian: No data recorded If Minor and Not Living with Parent(s), Who has Custody? N/A  Is CPS involved or ever been involved? Never  Is APS involved or ever been involved? Never   Patient Determined To Be At Risk for Harm To Self or Others Based on Review of Patient Reported Information or Presenting Complaint? No  Method: No data recorded Availability of Means: No data recorded Intent: No data recorded Notification Required: No data recorded Additional Information for Danger to Others Potential: No data recorded Additional Comments for Danger to Others Potential: No data recorded Are There Guns or Other Weapons in Your Home? No data recorded Types of Guns/Weapons: No data recorded Are These Weapons Safely Secured?                            No data recorded Who Could Verify You Are Able To Have These Secured: No data recorded Do You Have any Outstanding Charges, Pending Court Dates, Parole/Probation? No data recorded  Contacted To Inform of Risk of Harm To Self or Others: No data recorded  Location of Assessment: No data recorded  Does Patient Present under Involuntary Commitment? No  IVC Papers Initial File Date: No data recorded  [place] of Residence: [place]   Patient Currently Receiving the Following Services: CD--IOP (Intensive Chemical Dependency Program)   Determination of Need:  Routine (7 days)   Options For Referral: Other: Comment     CCA Biopsychosocial  Intake/Chief Complaint:  CCA Intake With Chief Complaint CCA Part Two Date: 10/16/19 Chief Complaint/Presenting Problem: Client is a 52 y/o divorced Caucasian male referred by girlfriend for [place] following relapse after completion of residential program for alcohol abuse. Client reports "being sober 80 of the last 91 days." At the time of assessment client had no alcohol for 5 days. Client reports a pattern of binge drinking following short term sobriety Patient's Currently Reported Symptoms/Problems: alcohol abuse. client denies [place] concerns Individual's Strengths: articulate, holds job, support system intact Individual's Preferences: unsure; open to tx for alcohol use Individual's Abilities: verbal, motivated, hx short term sobriety, supportive partner, steady job Type of Services Patient Feels Are Needed: open to recommendations Initial Clinical Notes/Concerns: See Below Client is a 49 y/o divorced Caucasian male referred by girlfriend for [place] following relapse after completion of residential program for alcohol abuse. Client reports "being sober 80 of the last 91 days." At the time of assessment client had no alcohol for 5 days. Client reports a pattern of binge drinking following short term sobriety (50 days sober followed by a 9 day binge followed by 30 days sober, followed by binge.) Client reported relapse previous 04-22-22 following father's death.  Primary stressor listed is ex-wife with whom he shares 50/50 custody of his 2 children and concern related to her using treatment for alcohol use in court custody. Client denies many symptoms of anxiety or depression thought notes being placed on an anti-depressant last year after his fathers death which he has remained on (escitalopram). PHQ9 score of 7, GAD7 score of 0. Client minimizes effect of consequences on his work life (calling in sick not working 'up to  capacity, scheduling meetings around drinking) and home (planning drinking around activities with children as he does not drink when he has his children or show up intoxicated to their events). Client has an AUDIT score of 21  FAMILY Client was born in [place] raised by both parents. Bio family moved to [facility] when client was in 8th grade for his father's Architect business. Client has one half-sister from father's previous marriage who is estranged from the family due to [facility] and SUD. Client reported his mom is 'the best woman you'll meet" and identifies he father as his best friend and a functional alcoholic. Client still talks with and sees his mother regularly who lives in [place]. Client's father passed away in 21-Jan-2018. Client stated his father had a couple drinks and fell (age 14) and hit is head, get a brain bleed, would not due physical rehab resulting in him being bedridden for 2 years when he died at age 74. Client made a note this was taxing on his mother. Client identified paternal grandfather as a 'raging alcoholic' Client has twin boys age 15, and daughter age 95. Client's girlfriend of 2.5 years also has twin 27 y/o sons. Client was unable to see his children for around 2 weeks following wife's allegations of child endangerment for drinking and driving. Client endorsed drinking but not being intoxicated and  allegations were dismissed.  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  SUBSTANCE ABUSE HISTORY Client denies overt triggers for use which he has identified other than stress related to his ex-wife. Client  reports coming home intoxicated one time in highschool, starting drinking in college, increase toward the end of his 1st marriage before moving out/separating (2017 first note in EHR for acute stress reaction and self medicating which client initially denied). Client notes switching for heavily drinking bourbon to beer, 'I enjoyed drinking and it got out of hand.' Client was in the ED early 2020 'worried about w/d symtoms' and seizure. Client described some morning tremors and needing to drink 'nips,' a small amount of alcohol to avoid tremors some mornings before work.  Client attended [facility] residential in May 2021, 'I enjoyed drinking and it got out of hand.20 years of drinking getting worse.I didn't see myself stopping on my own' and maintained sobriety for 50 days, relapsed snd returned for 5 days, then 30 days sober 'didn't drink most of the summer.' Client stated no trouble not drinking unless it was offered to him. Following discharge client was linked with [facility] for medication management and seen via telehealth by a doctor out of [place]. Client stated outpatient therapy was not available due to COVID. Client is currently prescribed vivitrol and is waiting for his medication to come in to receive a shot. Client has a Haematologist with people met at [facility] weekly. Client previously drinking 'a lot' of bourbon for which he did not give an amount, and recently 8-10 'tiny wines' nightly for round a week. Client has been dipping tobacco since college (no more than 6 months with no use) at around 2 cans/day. Client denies use of any other substances.   Mental Health Symptoms Depression:  Depression: None (reports none since father passed away but has stayed on lexapro prescribed by PCP since)  Mania:  Mania: None  Anxiety:   Anxiety: None  Psychosis:  Psychosis: None  Trauma:  Trauma: None (client denies)  Obsessions:  Obsessions: None  Compulsions:  Compulsions: None (substance use)   Inattention:  Inattention: None  Hyperactivity/Impulsivity:  Hyperactivity/Impulsivity: N/A  Oppositional/Defiant Behaviors:  Oppositional/Defiant Behaviors: None  Emotional Irregularity:  Emotional Irregularity: None  Other Mood/Personality Symptoms:      Mental Status Exam Appearance and self-care  Stature:  Stature: Average  Weight:  Weight: Average weight  Clothing:  Clothing: Neat/clean  Grooming:  Grooming: Well-groomed  Cosmetic use:  Cosmetic Use: None  Posture/gait:  Posture/Gait: Normal  Motor activity:  Motor Activity: Not Remarkable  Sensorium  Attention:  Attention: Normal  Concentration:  Concentration: Normal  Orientation:  Orientation: X5  Recall/memory:  Recall/Memory: Normal  Affect and Mood  Affect:  Affect: Appropriate  Mood:  Mood: Euthymic  Relating  Eye contact:  Eye Contact: Normal  Facial expression:  Facial Expression: Responsive  Attitude toward examiner:  Attitude Toward Examiner: Cooperative  Thought and Language  Speech flow: Speech Flow: Clear and Coherent  Thought content:  Thought Content: Appropriate to Mood and Circumstances  Preoccupation:  Preoccupations: None  Hallucinations:  Hallucinations: None  Organization:     Transport planner of Knowledge:  Fund of Knowledge: Average  Intelligence:  Intelligence: Average  Abstraction:  Abstraction: Normal  Judgement:  Judgement: Fair  Art therapist:  Reality Testing: Realistic  Insight:  Insight: Fair  Decision Making:  Decision Making: Impulsive, Normal  Social Functioning  Social Maturity:  Social Maturity: Impulsive  Social Judgement:  Social Judgement: Normal  Stress  Stressors:  Stressors: Family conflict, Relationship, Work  Coping Ability:  Coping Ability: Normal  Skill Deficits:  Skill Deficits: Self-control  Supports:  Supports: Family, Friends/Service system     Religion: Religion/Spirituality Are You A Religious Person?: No  Leisure/Recreation: Leisure /  Recreation Do You Have Hobbies?: Yes  Exercise/Diet: Exercise/Diet Do You Exercise?: No Have You Gained or Lost A Significant Amount of Weight in the Past Six Months?: No Do You Follow a Special Diet?: No Do You Have Any Trouble Sleeping?: No   CCA Employment/Education  Employment/Work Situation: Employment / Work Situation Employment situation: Employed 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 Patient's job has been impacted by current illness: Yes Describe how patient's job has been impacted: people covering at the office, showing up late, being less productive What is the longest time patient has a held a job?: 28 managing; longer working Where was the patient employed at that time?: fathers then his company (family owned business) Has patient ever been in the [employer]?: No  Education: Education Is Patient Currently Attending School?: No Last Grade Completed: 12 Did Teacher, adult education From [employer]?: Yes Did Physicist, medical?: Yes What Type of College Degree Do you Have?: N/A 1 year at [employer], 1.5 years at [employer]; stopped to [employer] over dads company Did [employer]?: No 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 Did You Have Any Special Interests In School?: soccer, playing rec sports Did You Have An Individualized Education Program (IIEP): No Did You Have Any Difficulty At School?: No Patient's Education Has Been Impacted by Current Illness: No   CCA Family/Childhood History  Family and Relationship History: Family history Marital status: Long term relationship Long term relationship, how long?: 2.5 years What types of issues is patient dealing with in the relationship?: girlfriend stated client needed to get help 'or else' Additional relationship information: ex-wife stressor divorced 2018 Are you sexually active?: Yes What  is your sexual orientation?: heterosexual Has your sexual activity been affected by drugs, alcohol, medication, or emotional stress?: denies Does patient have children?: Yes How many children?: 3 How is patient's relationship with their children?: 'good' client reports not drinking around his children and making sure to not be hung over for attending any events (coaching teams)  50/50 custody with ex-wife  Childhood History:  Childhood History By whom was/is the patient raised?: Both parents Additional childhood history information: mother "best person you've every met" father "functional alcoholic" Description of patient's relationship with caregiver when they were a child: postive; supportive Patient's description of current relationship with people who raised him/her: father deceased; mother talk and visit frequently How were you disciplined when you got in trouble as a child/adolescent?: appropriately; father would take client to work on weekends Engineer, materials) as punishment Does patient have siblings?: Yes Number of Siblings: 1 Description of patient's current relationship with siblings: half siblings; older; poly substance abuse; estranged from family; havent seen in 68 years 'thats not long enough' Did patient suffer any  verbal/emotional/physical/sexual abuse as a child?: No Did patient suffer from severe childhood neglect?: No Has patient ever been sexually abused/assaulted/raped as an adolescent or adult?: No Was the patient ever a victim of a crime or a disaster?: No Witnessed domestic violence?: No Has patient been affected by domestic violence as an adult?: No  Child/Adolescent Assessment:     CCA Substance Use  Alcohol/Drug Use: Alcohol / Drug Use Pain Medications: none Prescriptions: escitalopram Over the Counter: none History of alcohol / drug use?: Yes Longest period of sobriety (when/how long): 50 days Negative Consequences of Use: Legal, Personal relationships,  Work / School (1 DWI) Withdrawal Symptoms: Tremors, Nausea / Vomiting, Sweats Substance #1 Name of Substance 1: alcohol 1 - Age of First Use: 18 1 - Amount (size/oz): 8-10 'tiny wine' 1 - Frequency: daily/recent week binge 1 - Duration: less than 2 week binge 1 - Last Use / Amount: 10/11/19 Substance #2 Name of Substance 2: nicotine 2 - Age of First Use: college 2 - Amount (size/oz): 2 cans 2 - Frequency: daily 2 - Duration: 20+ years 2 - Last Use / Amount: 10/16/19    ASAM's:  Six Dimensions of Multidimensional Assessment  Dimension 1:  Acute Intoxication and/or Withdrawal Potential:   Dimension 1:  Description of individual's past and current experiences of substance use and withdrawal: recently week binge  Dimension 2:  Biomedical Conditions and Complications:   Dimension 2:  Description of patient's biomedical conditions and  complications: client denies medical concerns, recent physical and bloodwork per client with no problems voiced from PCP; hx hand tremors after not drinking  Dimension 3:  Emotional, Behavioral, or Cognitive Conditions and Complications:  Dimension 3:  Description of emotional, behavioral, or cognitive conditions and complications: on anti-depressant; denies any problematic mood symptoms  Dimension 4:  Readiness to Change:  Dimension 4:  Description of Readiness to Change criteria: minimizes use and consequences of use  Dimension 5:  Relapse, Continued use, or Continued Problem Potential:  Dimension 5:  Relapse, continued use, or continued problem potential critiera description: mutlipel relapses since completing residential in may 2021; longest sobriety 50 days  Dimension 6:  Recovery/Living Environment:  Dimension 6:  Recovery/Iiving environment criteria description: girlfriend supportive, has children 1/2 the week and does not drink when they're in his custody  ASAM Severity Score: ASAM's Severity Rating Score: 10  ASAM Recommended Level of Treatment: ASAM  Recommended Level of Treatment: Level II Intensive Outpatient Treatment   Substance use Disorder (SUD) Substance Use Disorder (SUD)  Checklist Symptoms of Substance Use: Continued use despite having a persistent/recurrent physical/psychological problem caused/exacerbated by use, Continued use despite persistent or recurrent social, interpersonal problems, caused or exacerbated by use, Evidence of withdrawal (Comment), Large amounts of time spent to obtain, use or recover from the substance(s), Presence of craving or strong urge to use, Persistent desire or unsuccessful efforts to cut down or control use, Recurrent use that results in a failure to fulfill major role obligations (work, school, home), Repeated use in physically hazardous situations, Evidence of tolerance, Substance(s) often taken in larger amounts or over longer times than was intended  Recommendations for Services/Supports/Treatments: Recommendations for Services/Supports/Treatments Recommendations For Services/Supports/Treatments: CD-IOP Intensive Chemical Dependency Program  DSM5 Diagnoses: There are no problems to display for this patient.   Patient Centered Plan: Patient is on the following Treatment Plan(s):  Substance Abuse   Referrals to Alternative Service(s): Recommend CDIOP to address alcohol use disorder severe. Goals to achieve and maintain sobriety 7/7 days weekly.  Increase use of healthy coping skills 7/7 days weekly. Maintain sober support community.  Referred to Alternative Service(s):   Place:   Date:   Time:    Referred to Alternative Service(s):   Place:   Date:   Time:    Referred to Alternative Service(s):   Place:   Date:   Time:    Referred to Alternative Service(s):   Place:   Date:   Time:     Olegario Messier, LCSW

## 2019-10-25 ENCOUNTER — Emergency Department (HOSPITAL_COMMUNITY): Payer: BLUE CROSS/BLUE SHIELD

## 2019-10-25 ENCOUNTER — Other Ambulatory Visit: Payer: Self-pay

## 2019-10-25 ENCOUNTER — Encounter (HOSPITAL_COMMUNITY): Payer: Self-pay | Admitting: Emergency Medicine

## 2019-10-25 ENCOUNTER — Inpatient Hospital Stay (HOSPITAL_COMMUNITY)
Admission: EM | Admit: 2019-10-25 | Discharge: 2019-10-27 | DRG: 603 | Disposition: A | Discharge status: Home / Self Care | Payer: BLUE CROSS/BLUE SHIELD | Attending: Internal Medicine | Admitting: Internal Medicine

## 2019-10-25 DIAGNOSIS — K219 Gastro-esophageal reflux disease without esophagitis: Secondary | ICD-10-CM | POA: Diagnosis present

## 2019-10-25 DIAGNOSIS — R918 Other nonspecific abnormal finding of lung field: Secondary | ICD-10-CM | POA: Diagnosis not present

## 2019-10-25 DIAGNOSIS — Z20822 Contact with and (suspected) exposure to covid-19: Secondary | ICD-10-CM | POA: Diagnosis present

## 2019-10-25 DIAGNOSIS — F1020 Alcohol dependence, uncomplicated: Secondary | ICD-10-CM | POA: Diagnosis not present

## 2019-10-25 DIAGNOSIS — R000 Tachycardia, unspecified: Secondary | ICD-10-CM | POA: Diagnosis not present

## 2019-10-25 DIAGNOSIS — M7989 Other specified soft tissue disorders: Secondary | ICD-10-CM | POA: Diagnosis not present

## 2019-10-25 DIAGNOSIS — Y908 Blood alcohol level of 240 mg/100 ml or more: Secondary | ICD-10-CM | POA: Diagnosis not present

## 2019-10-25 DIAGNOSIS — Z880 Allergy status to penicillin: Secondary | ICD-10-CM | POA: Diagnosis not present

## 2019-10-25 DIAGNOSIS — Z720 Tobacco use: Secondary | ICD-10-CM

## 2019-10-25 DIAGNOSIS — M7021 Olecranon bursitis, right elbow: Secondary | ICD-10-CM | POA: Diagnosis not present

## 2019-10-25 DIAGNOSIS — M7711 Lateral epicondylitis, right elbow: Secondary | ICD-10-CM | POA: Diagnosis not present

## 2019-10-25 DIAGNOSIS — F1021 Alcohol dependence, in remission: Secondary | ICD-10-CM

## 2019-10-25 DIAGNOSIS — L03113 Cellulitis of right upper limb: Secondary | ICD-10-CM | POA: Diagnosis not present

## 2019-10-25 DIAGNOSIS — Z23 Encounter for immunization: Secondary | ICD-10-CM | POA: Diagnosis not present

## 2019-10-25 DIAGNOSIS — M25421 Effusion, right elbow: Secondary | ICD-10-CM | POA: Diagnosis not present

## 2019-10-25 LAB — COMPREHENSIVE METABOLIC PANEL
ALT: 20 U/L (ref 0–44)
AST: 20 U/L (ref 15–41)
Albumin: 4.1 g/dL (ref 3.5–5.0)
Alkaline Phosphatase: 61 U/L (ref 38–126)
Anion gap: 11 (ref 5–15)
BUN: 9 mg/dL (ref 6–20)
CO2: 26 mmol/L (ref 22–32)
Calcium: 9.4 mg/dL (ref 8.9–10.3)
Chloride: 106 mmol/L (ref 98–111)
Creatinine, Ser: 0.96 mg/dL (ref 0.61–1.24)
GFR, Estimated: >60 mL/min (ref >60)
Glucose, Bld: 84 mg/dL (ref 70–99)
Potassium: 3.4 mmol/L — ABNORMAL LOW (ref 3.5–5.1)
Sodium: 143 mmol/L (ref 135–145)
Total Bilirubin: 0.8 mg/dL (ref 0.3–1.2)
Total Protein: 6.6 g/dL (ref 6.5–8.1)

## 2019-10-25 LAB — URINALYSIS, ROUTINE W REFLEX MICROSCOPIC
Bilirubin Urine: NEGATIVE
Glucose, UA: NEGATIVE mg/dL
Hgb urine dipstick: NEGATIVE
Ketones, ur: NEGATIVE mg/dL
Leukocytes,Ua: NEGATIVE
Nitrite: NEGATIVE
Protein, ur: NEGATIVE mg/dL
Specific Gravity, Urine: 1.003 — ABNORMAL LOW (ref 1.005–1.030)
pH: 6 (ref 5.0–8.0)

## 2019-10-25 LAB — CBC WITH DIFFERENTIAL/PLATELET
Abs Immature Granulocytes: 0.03 10*3/uL (ref 0.00–0.07)
Basophils Absolute: 0.1 10*3/uL (ref 0.0–0.1)
Basophils Relative: 1 %
Eosinophils Absolute: 0 10*3/uL (ref 0.0–0.5)
Eosinophils Relative: 0 %
HCT: 40.8 % (ref 39.0–52.0)
Hemoglobin: 13.9 g/dL (ref 13.0–17.0)
Immature Granulocytes: 0 %
Lymphocytes Relative: 16 %
Lymphs Abs: 1.7 10*3/uL (ref 0.7–4.0)
MCH: 32.6 pg (ref 26.0–34.0)
MCHC: 34.1 g/dL (ref 30.0–36.0)
MCV: 95.6 fL (ref 80.0–100.0)
Monocytes Absolute: 0.9 10*3/uL (ref 0.1–1.0)
Monocytes Relative: 8 %
Neutro Abs: 7.7 10*3/uL (ref 1.7–7.7)
Neutrophils Relative %: 75 %
Platelets: 390 10*3/uL (ref 150–400)
RBC: 4.27 MIL/uL (ref 4.22–5.81)
RDW: 14.2 % (ref 11.5–15.5)
WBC: 10.3 10*3/uL (ref 4.0–10.5)
nRBC: 0 % (ref 0.0–0.2)

## 2019-10-25 LAB — ETHANOL: Alcohol, Ethyl (B): 252 mg/dL — ABNORMAL HIGH (ref <10)

## 2019-10-25 LAB — APTT: aPTT: 34 seconds (ref 24–36)

## 2019-10-25 LAB — LACTIC ACID, PLASMA
Lactic Acid, Venous: 2.5 mmol/L (ref 0.5–1.9)
Lactic Acid, Venous: 1.9 mmol/L (ref 0.5–1.9)
Lactic Acid, Venous: 2.4 mmol/L (ref 0.5–1.9)
Lactic Acid, Venous: 1.5 mmol/L (ref 0.5–1.9)

## 2019-10-25 LAB — RESPIRATORY PANEL BY RT PCR (FLU A&B, COVID)
Influenza A by PCR: NEGATIVE
Influenza B by PCR: NEGATIVE
SARS Coronavirus 2 by RT PCR: NEGATIVE

## 2019-10-25 LAB — PROTIME-INR
INR: 1 (ref 0.8–1.2)
Prothrombin Time: 12.5 seconds (ref 11.4–15.2)

## 2019-10-25 MED ORDER — LACTATED RINGERS IV BOLUS (SEPSIS)
1000.0000 mL | Freq: Once | INTRAVENOUS | Status: AC
  Administered 2019-10-25: 1000 mL via INTRAVENOUS

## 2019-10-25 MED ORDER — OXYCODONE HCL 5 MG PO TABS: 5.0000 mg | ORAL_TABLET | Freq: Three times a day (TID) | ORAL | Status: DC | PRN

## 2019-10-25 MED ORDER — VANCOMYCIN HCL IN DEXTROSE 1-5 GM/200ML-% IV SOLN
1000.0000 mg | Freq: Once | INTRAVENOUS | Status: AC
  Administered 2019-10-25: 1000 mg via INTRAVENOUS
  Filled 2019-10-25: qty 200

## 2019-10-25 MED ORDER — VANCOMYCIN HCL IN DEXTROSE 1-5 GM/200ML-% IV SOLN: 1000.0000 mg | Freq: Three times a day (TID) | INTRAVENOUS | Status: DC

## 2019-10-25 MED ORDER — THIAMINE HCL 100 MG PO TABS
100.0000 mg | ORAL_TABLET | Freq: Every day | ORAL | Status: DC
  Administered 2019-10-25 – 2019-10-27 (×3): 100 mg via ORAL
  Filled 2019-10-25 (×3): qty 1

## 2019-10-25 MED ORDER — LACTATED RINGERS IV SOLN: INTRAVENOUS | Status: AC

## 2019-10-25 MED ORDER — SODIUM CHLORIDE 0.9 % IV SOLN
2.0000 g | Freq: Once | INTRAVENOUS | Status: AC
  Administered 2019-10-25: 2 g via INTRAVENOUS
  Filled 2019-10-25: qty 20

## 2019-10-25 MED ORDER — CEFAZOLIN SODIUM-DEXTROSE 1-4 GM/50ML-% IV SOLN
1.0000 g | Freq: Three times a day (TID) | INTRAVENOUS | Status: DC
  Administered 2019-10-26: 1 g via INTRAVENOUS
  Filled 2019-10-25 (×2): qty 50

## 2019-10-25 MED ORDER — HEPARIN SODIUM (PORCINE) 5000 UNIT/ML IJ SOLN
5000.0000 [IU] | Freq: Three times a day (TID) | INTRAMUSCULAR | Status: DC
  Administered 2019-10-25 – 2019-10-27 (×5): 5000 [IU] via SUBCUTANEOUS
  Filled 2019-10-25 (×5): qty 1

## 2019-10-25 NOTE — ED Triage Notes (Signed)
Pt from [facility] for cellulitis to R elbow x 24-48 hours.  Denies fever.  Reports chills.

## 2019-10-25 NOTE — ED Provider Notes (Signed)
[facility] EMERGENCY DEPARTMENT Provider Note   CSN: [id number] Arrival date & time: 10/25/19  1526     History Chief Complaint  Patient presents with  . Cellulitis    Robert Norton is a 49 y.o. male.  HPI    49 year old male presents today for cellulitis of right arm and elbow.  Patient reports that he began having some swelling and redness of this area yesterday.  He circled area over his elbow last night at 6 PM.  The redness had increased today.  He has not had fever but he has had chills.  Was seen at urgent care and sent here for further evaluation.  He reports a similar episode several years ago in [place].  I am unable to locate this in the medical record.  However, he has had episodes of lateral epicondylitis which she has been treated noted in his past medical record. Patient reports that he Past Medical History:  Diagnosis Date  . Abdominal cramps   . Acute meniscal tear of left knee   . Chest pain   . Cough   . Diaphoresis   . Diarrhea   . Elbow pain   . Elevated liver enzymes   . Epicondylitis, lateral   . ETOH abuse   . GERD without esophagitis   . Left knee pain   . Mallet fracture, closed   . Pain in left shin   . Shoulder pain   . Sinusitis   . URI (upper respiratory infection)     There are no problems to display for this patient.   Past Surgical History:  Procedure Laterality Date  . APPENDECTOMY    . KNEE SURGERY    . PILONIDAL CYST DRAINAGE         Family History  Problem Relation Age of Onset  . Diabetes Mother     Social History   Tobacco Use  . Smoking status: Never Smoker  . Smokeless tobacco: Current User    Types: Snuff  Vaping Use  . Vaping Use: Never used  Substance Use Topics  . Alcohol use: Yes    Comment: former alcoholic; drinks occ  . Drug use: No    Home Medications Prior to Admission medications   Medication Sig Start Date End Date Taking? Authorizing Provider  chlordiazePOXIDE  (LIBRIUM) 25 MG capsule 50mg  PO TID x 1D, then 25-50mg  PO BID X 1D, then 25-50mg  PO QD prn 02/21/17   02/23/17, MD    Allergies    Penicillins  Review of Systems   Review of Systems  All other systems reviewed and are negative.   Physical Exam Updated Vital Signs BP 107/70 (BP Location: Left Arm)   Pulse (!) 107   Temp 98.1 F (36.7 C) (Oral)   Resp 18   SpO2 100%   Physical Exam Vitals reviewed.  Constitutional:      Appearance: Normal appearance.  HENT:     Head: Normocephalic.     Right Ear: External ear normal.     Left Ear: External ear normal.     Nose: Nose normal.     Mouth/Throat:     Mouth: Mucous membranes are moist.  Eyes:     Pupils: Pupils are equal, round, and reactive to light.  Cardiovascular:     Rate and Rhythm: Tachycardia present.     Pulses: Normal pulses.  Pulmonary:     Effort: Pulmonary effort is normal.  Abdominal:     General: Abdomen is  flat.  Musculoskeletal:     Cervical back: Normal range of motion.     Comments: 20 cm area of erythema posterior aspect right arm around the elbow with induration and probable underlying fluid. Full active range of motion of elbow No axillary adenopathy  Skin:    General: Skin is warm.     Capillary Refill: Capillary refill takes less than 2 seconds.  Neurological:     General: No focal deficit present.     Mental Status: He is alert.  Psychiatric:        Mood and Affect: Mood normal.     ED Results / Procedures / Treatments   Labs (all labs ordered are listed, but only abnormal results are displayed) Labs Reviewed  LACTIC ACID, PLASMA - Abnormal; Notable for the following components:      Result Value   Lactic Acid, Venous 2.5 (*)    All other components within normal limits  COMPREHENSIVE METABOLIC PANEL - Abnormal; Notable for the following components:   Potassium 3.4 (*)    All other components within normal limits  CBC WITH DIFFERENTIAL/PLATELET    EKG None  Radiology DG  Elbow Complete Right  Result Date: 10/25/2019 CLINICAL DATA:  Swelling laterally EXAM: RIGHT ELBOW - COMPLETE 3+ VIEW COMPARISON:  None. FINDINGS: Diffuse soft tissue swelling. No acute bony abnormality. Specifically, no fracture, subluxation, or dislocation. No joint effusion. IMPRESSION: Diffuse soft tissue swelling.  No acute bony abnormality. Electronically Signed   By: Charlett Nose M.D.   On: 10/25/2019 17:36    Procedures Procedures (including critical care time)  Medications Ordered in ED Medications  lactated ringers infusion (has no administration in time range)  vancomycin (VANCOCIN) IVPB 1000 mg/200 mL premix (has no administration in time range)  cefTRIAXone (ROCEPHIN) 2 g in sodium chloride 0.9 % 100 mL IVPB (has no administration in time range)  lactated ringers bolus 1,000 mL (has no administration in time range)    And  lactated ringers bolus 1,000 mL (has no administration in time range)    And  lactated ringers bolus 1,000 mL (has no administration in time range)    ED Course  I have reviewed the triage vital signs and the nursing notes.  Pertinent labs & imaging results that were available during my care of the patient were reviewed by me and considered in my medical decision making (see chart for details).        49 year old male with right upper extremity cellulitis and swelling over the elbow.  No fever but patient has been having chills.  He is tachycardic and has a mildly elevated lactic acid at 2.5.  Patient is receiving     IV fluids, imaging of right elbow, and antibiotics.                      Discussed with Ortho and they will see in consult  MDM Rules/Calculators/A&P                           Final Clinical Impression(s) / ED Diagnoses Final diagnoses:  Cellulitis of right upper extremity    Rx / DC Orders ED Discharge Orders    None       Margarita Grizzle, MD 10/27/19 1117

## 2019-10-25 NOTE — H&P (Signed)
Date: 10/25/2019               Patient Name:  Robert Norton MRN: [id number]  DOB: Oct 29, 1970 Age / Sex: 49 y.o., male   PCP: Patient, No Pcp Per         Medical Service: Internal Medicine Teaching Service         Attending Physician: Dr. Carlynn Purl    First Contact: Dr. Claudette Laws Pager: [phone]  Second Contact: Dr. Sande Brothers Pager: [phone]       After Hours (After 5p/  First Contact Pager: [phone]  weekends / holidays): Second Contact Pager: [phone]   Chief Complaint: RUE redness and swelling  History of Present Illness:   Robert Norton is a 49 y.o. year old male with hx of GERD, R lateral epicondylitis, alcohol use disorder presenting for evaluation of right elbow swelling and pain. He states that he was in his usual state of health until around 1pm yesterday when his son bumped his elbow and he noticed it was unusually tender. Looked and noticed it was swollen and red. Drew a line around it yesterday night around 6pm and the rash has spread beyond. States the elbow hurt to move last night, but has since improved. States he fell and scraped his elbow on the steps leading up to his house 2 days ago, no other injuries ir breaks in the skin. He reports having a similar problem with this arm 5 years ago while in [place], and that the elbow joint was drained at that time. Does not recall a prolonged course of antibiotics. Has hx of R tennis elbow but no other procedures or hardware. Denies IVDU, but electively receives a B12 injection and IV fluids roughly every 3 weeks from a friend who runs a business called [employer]. Had some chills after icing the joint. No fevers, weight loss, fatigue, chest pain, palpitations, SOB.  He is a "recovering alcoholic." Is on medications at home to help with that. States his last drink was 1 shot of brandy last night to help with pain and sleep. Otherwise denies any alcohol use lately.   Meds:  Current Outpatient Medications   Medication Instructions  . chlordiazePOXIDE (LIBRIUM) 25 MG capsule 50mg  PO TID x 1D, then 25-50mg  PO BID X 1D, then 25-50mg  PO QD prn  . cloNIDine (CATAPRES) 0.1 mg, Oral, 3 times daily  . escitalopram (LEXAPRO) 5 mg, Oral, Daily  . gabapentin (NEURONTIN) 300 mg, Oral, 2 times daily  . naltrexone (DEPADE) 50 mg, Oral, Daily    Allergies: Allergies as of 10/25/2019 - Review Complete 10/25/2019  Allergen Reaction Noted  . Penicillins Hives and Rash 02/05/2017   Past Medical History:  Diagnosis Date  . Abdominal cramps   . Acute meniscal tear of left knee   . Chest pain   . Cough   . Diaphoresis   . Diarrhea   . Elbow pain   . Elevated liver enzymes   . Epicondylitis, lateral   . ETOH abuse   . GERD without esophagitis   . Left knee pain   . Mallet fracture, closed   . Pain in left shin   . Shoulder pain   . Sinusitis   . URI (upper respiratory infection)     Family History:  Family History  Problem Relation Age of Onset  . Diabetes Mother     Social History:  Social History   Tobacco Use  . Smoking status: Never Smoker  .  Smokeless tobacco: Current User    Types: Chew  . Tobacco comment: 1.5 tins per day for 20 years  Vaping Use  . Vaping Use: Never used  Substance Use Topics  . Alcohol use: Yes    Comment: former alcoholic; drinks occ  . Drug use: No    Review of Systems: Review of Systems  Constitutional: Positive for chills. Negative for fever, malaise/fatigue and weight loss.  HENT: Negative for congestion and sore throat.   Eyes: Negative for blurred vision and double vision.  Respiratory: Negative for cough and shortness of breath.   Cardiovascular: Negative for chest pain and palpitations.  Gastrointestinal: Negative for nausea and vomiting.  Genitourinary: Negative for dysuria.  Musculoskeletal: Negative for back pain, myalgias and neck pain.  Skin: Positive for rash (swelling and redness over R elbow and arm). Negative for itching.   Neurological: Negative for dizziness, loss of consciousness, weakness and headaches.     Physical Exam: Physical Exam Vitals and nursing note reviewed.  Constitutional:      General: He is not in acute distress.    Appearance: Normal appearance. He is not ill-appearing.  HENT:     Head: Normocephalic and atraumatic.     Right Ear: External ear normal.     Left Ear: External ear normal.     Nose: Nose normal.     Mouth/Throat:     Mouth: Mucous membranes are moist.  Eyes:     Extraocular Movements: Extraocular movements intact.     Conjunctiva/sclera: Conjunctivae normal.  Cardiovascular:     Rate and Rhythm: Normal rate and regular rhythm.     Heart sounds: Normal heart sounds. No murmur heard.  No friction rub. No gallop.   Pulmonary:     Effort: Pulmonary effort is normal. No respiratory distress.     Breath sounds: Normal breath sounds. No stridor. No wheezing, rhonchi or rales.  Abdominal:     General: Abdomen is flat.     Palpations: Abdomen is soft.     Tenderness: There is no abdominal tenderness. There is no guarding.  Musculoskeletal:        General: Normal range of motion.     Cervical back: Normal range of motion and neck supple.  Skin:    General: Skin is warm and dry.     Comments: Large area of erythema, swelling, and warmth of the RUE which overlies the elbow and extends beyond A circle is marked which the patient reports was done at 6pm last night, the redness now extends well beyond it Small lesion on the posterior aspect of the elbow appearing like a healed abrasion, no purulence Not significantly tender No tightness  Able to move the elbow without significant pain, nontender on exam  Neurological:     General: No focal deficit present.     Mental Status: He is alert and oriented to person, place, and time.  Psychiatric:        Mood and Affect: Mood normal.        Behavior: Behavior normal.        EKG: personally reviewed my interpretation is  NSR, no ST changes  CXR: personally reviewed my interpretation is small linear opacity overlying the L upper lobe, likely external object such as part of his clothing. Otherwise unremarkable  R elbow xray: Diffuse soft tissue swelling.  No acute bony abnormality.  Assessment & Plan by Problem: Principal Problem:   Cellulitis of right arm Active Problems:   Alcohol use disorder,  moderate, in early remission, dependence (HCC)  Robert Norton is a 49 y.o. year old male with hx of GERD, R lateral epicondylitis, alcohol use disorder presenting for evaluation of right elbow swelling and pain. Hx of similar which required drainage 5 years ago, no prior surgeries or hardware in that elbow. Symptoms improving.   RUE cellulitis with elbow swelling Area of swelling, erythema, and warmth to the R elbow which wasted 1 day prior to admission and expanded fairly rapidly. Not significantly tender on exam, no crepitus. Small lesion which may be where the infection entered the skin, but no purulence there or elsewhere. Xray with soft tissue swelling only. Had similar problem before which required drainage while in [place], unable to obtain records. Hx of R lateral epicondylitis but no prior surgery or hardware. LA of 2.5 > 1.9 > 2.4 likely 2/2 alcohol use. S/p 3x 1L LR boluses in the ED and getting mIVF. No leukocytosis. Noted to receive IVF infusions every 3 weeks from a friend that owns a company doing this, but no ongoing fevers or fatigue suggesting endocarditis or other systemic infection.  -s/p vanc and ceftriaxone in ED, switching to cefazolin tomorrow -f/u blood culture -maintenance fluids -pain control with oxycodone 5mg  -ortho consulted by ED, will f/u recommendations -daily CBC  Alcohol use disorder  Endorses a history of heavy alcohol use, states that naltrexone and clonidine are helping. States his last drink was 1 shot of brandy on the evening of 10/16 to help with pain in his elbow. Denies  any recent use besides that. However, had blood alcohol of 252 on admission. -CIWA protocol without Ativan -thiamine 100mg  daily  Dispo: Admit patient to Observation with expected length of stay less than 2 midnights.  Signed: 11/16, MD 10/25/2019, 8:32 PM  Pager: [phone]  After 5pm on weekdays and 1pm on weekends: On Call pager: [phone]

## 2019-10-25 NOTE — Progress Notes (Signed)
Pharmacy Antibiotic Note  JORDELL OUTTEN is a 49 y.o. male admitted on 10/25/2019 with cellulitis.  Pharmacy has been consulted for vancomycin dosing.  Ceftriaxone per MD  Plan: Vancomycin 1750 mg IV x 1, then 1000 mg IV every 8 hours (target vancomycin trough 15-20) Monitor renal function, clinical progression and LOT Vancomycin trough at steady state      Temp (24hrs), Avg:98.1 F (36.7 C), Min:98.1 F (36.7 C), Max:98.1 F (36.7 C)  Recent Labs  Lab 10/25/19 1621  WBC 10.3  CREATININE 0.96  LATICACIDVEN 2.5*    CrCl cannot be calculated (Unknown ideal weight.).    Allergies  Allergen Reactions   Penicillins Hives    Daylene Posey, PharmD Clinical Pharmacist ED Pharmacist Phone # [phone] 10/25/2019 6:46 PM

## 2019-10-26 ENCOUNTER — Encounter (HOSPITAL_COMMUNITY): Payer: Self-pay | Admitting: Internal Medicine

## 2019-10-26 ENCOUNTER — Encounter (HOSPITAL_COMMUNITY): Payer: BLUE CROSS/BLUE SHIELD

## 2019-10-26 ENCOUNTER — Telehealth (HOSPITAL_COMMUNITY): Payer: Self-pay | Admitting: Licensed Clinical Social Worker

## 2019-10-26 DIAGNOSIS — L03113 Cellulitis of right upper limb: Secondary | ICD-10-CM | POA: Diagnosis present

## 2019-10-26 DIAGNOSIS — M7021 Olecranon bursitis, right elbow: Secondary | ICD-10-CM

## 2019-10-26 DIAGNOSIS — Y908 Blood alcohol level of 240 mg/100 ml or more: Secondary | ICD-10-CM | POA: Diagnosis present

## 2019-10-26 DIAGNOSIS — F1021 Alcohol dependence, in remission: Secondary | ICD-10-CM

## 2019-10-26 DIAGNOSIS — Z720 Tobacco use: Secondary | ICD-10-CM | POA: Diagnosis not present

## 2019-10-26 DIAGNOSIS — Z23 Encounter for immunization: Secondary | ICD-10-CM | POA: Diagnosis not present

## 2019-10-26 DIAGNOSIS — Z20822 Contact with and (suspected) exposure to covid-19: Secondary | ICD-10-CM | POA: Diagnosis present

## 2019-10-26 DIAGNOSIS — M7711 Lateral epicondylitis, right elbow: Secondary | ICD-10-CM

## 2019-10-26 DIAGNOSIS — K219 Gastro-esophageal reflux disease without esophagitis: Secondary | ICD-10-CM

## 2019-10-26 DIAGNOSIS — Z880 Allergy status to penicillin: Secondary | ICD-10-CM | POA: Diagnosis not present

## 2019-10-26 DIAGNOSIS — F1020 Alcohol dependence, uncomplicated: Secondary | ICD-10-CM | POA: Diagnosis present

## 2019-10-26 LAB — COMPREHENSIVE METABOLIC PANEL
ALT: 16 U/L (ref 0–44)
AST: 18 U/L (ref 15–41)
Albumin: 3.6 g/dL (ref 3.5–5.0)
Alkaline Phosphatase: 56 U/L (ref 38–126)
Anion gap: 10 (ref 5–15)
BUN: 6 mg/dL (ref 6–20)
CO2: 25 mmol/L (ref 22–32)
Calcium: 9.2 mg/dL (ref 8.9–10.3)
Chloride: 105 mmol/L (ref 98–111)
Creatinine, Ser: 0.76 mg/dL (ref 0.61–1.24)
GFR, Estimated: >60 mL/min (ref >60)
Glucose, Bld: 104 mg/dL — ABNORMAL HIGH (ref 70–99)
Potassium: 3.7 mmol/L (ref 3.5–5.1)
Sodium: 140 mmol/L (ref 135–145)
Total Bilirubin: 0.7 mg/dL (ref 0.3–1.2)
Total Protein: 6 g/dL — ABNORMAL LOW (ref 6.5–8.1)

## 2019-10-26 LAB — HIV ANTIBODY (ROUTINE TESTING W REFLEX): HIV Screen 4th Generation wRfx: NONREACTIVE

## 2019-10-26 LAB — CBC
HCT: 37.4 % — ABNORMAL LOW (ref 39.0–52.0)
Hemoglobin: 13.2 g/dL (ref 13.0–17.0)
MCH: 33.4 pg (ref 26.0–34.0)
MCHC: 35.3 g/dL (ref 30.0–36.0)
MCV: 94.7 fL (ref 80.0–100.0)
Platelets: 327 10*3/uL (ref 150–400)
RBC: 3.95 MIL/uL — ABNORMAL LOW (ref 4.22–5.81)
RDW: 13.9 % (ref 11.5–15.5)
WBC: 7.8 10*3/uL (ref 4.0–10.5)
nRBC: 0 % (ref 0.0–0.2)

## 2019-10-26 LAB — SYNOVIAL CELL COUNT + DIFF, W/ CRYSTALS
Crystals, Fluid: NONE SEEN
Eosinophils-Synovial: 0 % (ref 0–1)
Lymphocytes-Synovial Fld: 15 % (ref 0–20)
Monocyte-Macrophage-Synovial Fluid: 3 % — ABNORMAL LOW (ref 50–90)
Neutrophil, Synovial: 82 % — ABNORMAL HIGH (ref 0–25)
WBC, Synovial: 480 /mm3 — ABNORMAL HIGH (ref 0–200)

## 2019-10-26 MED ORDER — ADULT MULTIVITAMIN W/MINERALS CH
1.0000 | ORAL_TABLET | Freq: Every day | ORAL | Status: DC
  Administered 2019-10-26 – 2019-10-27 (×2): 1 via ORAL
  Filled 2019-10-26 (×2): qty 1

## 2019-10-26 MED ORDER — FOLIC ACID 1 MG PO TABS
1.0000 mg | ORAL_TABLET | Freq: Every day | ORAL | Status: DC
  Administered 2019-10-26 – 2019-10-27 (×2): 1 mg via ORAL
  Filled 2019-10-26 (×2): qty 1

## 2019-10-26 MED ORDER — MELATONIN 5 MG PO TABS
10.0000 mg | ORAL_TABLET | Freq: Every day | ORAL | Status: DC
  Administered 2019-10-26: 10 mg via ORAL
  Filled 2019-10-26: qty 2

## 2019-10-26 MED ORDER — VANCOMYCIN HCL 1500 MG/300ML IV SOLN
1500.0000 mg | Freq: Two times a day (BID) | INTRAVENOUS | Status: DC
  Administered 2019-10-26 – 2019-10-27 (×3): 1500 mg via INTRAVENOUS
  Filled 2019-10-26 (×4): qty 300

## 2019-10-26 MED ORDER — INFLUENZA VAC SPLIT QUAD 0.5 ML IM SUSY
0.5000 mL | PREFILLED_SYRINGE | INTRAMUSCULAR | Status: AC
  Administered 2019-10-27: 0.5 mL via INTRAMUSCULAR
  Filled 2019-10-26: qty 0.5

## 2019-10-26 MED ORDER — CEFAZOLIN SODIUM-DEXTROSE 2-4 GM/100ML-% IV SOLN
2.0000 g | Freq: Three times a day (TID) | INTRAVENOUS | Status: DC
  Filled 2019-10-26: qty 100

## 2019-10-26 MED ORDER — IBUPROFEN 400 MG PO TABS: 400.0000 mg | ORAL_TABLET | Freq: Four times a day (QID) | ORAL | Status: DC | PRN

## 2019-10-26 MED ORDER — SODIUM CHLORIDE 0.9 % IV SOLN
2.0000 g | INTRAVENOUS | Status: DC
  Administered 2019-10-26: 2 g via INTRAVENOUS
  Filled 2019-10-26: qty 20
  Filled 2019-10-26: qty 2

## 2019-10-26 MED ORDER — LORAZEPAM 2 MG/ML IJ SOLN: 1.0000 mg | INTRAMUSCULAR | Status: DC | PRN

## 2019-10-26 MED ORDER — LORAZEPAM 1 MG PO TABS: 1.0000 mg | ORAL_TABLET | ORAL | Status: DC | PRN

## 2019-10-26 MED ORDER — OXYCODONE HCL 5 MG PO TABS: 5.0000 mg | ORAL_TABLET | Freq: Three times a day (TID) | ORAL | Status: DC | PRN

## 2019-10-26 MED ORDER — POTASSIUM CHLORIDE CRYS ER 20 MEQ PO TBCR
40.0000 meq | EXTENDED_RELEASE_TABLET | Freq: Once | ORAL | Status: AC
  Administered 2019-10-26: 40 meq via ORAL
  Filled 2019-10-26: qty 2

## 2019-10-26 NOTE — Progress Notes (Signed)
HD#0 Subjective:   Patient admitted yesterday evening.  Evaluated at bedside during rounds. States his pain is well-controlled. He feels like the swelling around his elbow is improved, not as "puffed up" but notes that the swelling seems to be moving down his arm distally. States he has difficulty fully extending arm, but otherwise his range of motion is intact. Shares that he has been in touch with his PCP who asked if we would be evaluating his uric acid levels. Explained to patient that best test is when fluid from joint is tested. Regarding his alcohol use, he reiterates that aside from 1 shot of bourbon (for pain/sleep) the evening of 10/16, he had been 60 days sober. States, "I'm not worried about withdrawal," noting that he has never experienced withdrawal previously. Denies anxiety, nausea, abdominal pain.   Objective:   Vital signs in last 24 hours: Vitals:   10/25/19 2130 10/26/19 0100 10/26/19 0200 10/26/19 0503  BP: 107/79 117/73 105/79 121/75  Pulse: 86 (!) 102 98 81  Resp: 16   20  Temp:    98 F (36.7 C)  TempSrc:    Oral  SpO2: 100% 92% 90% 96%  Weight:    90.7 kg  Height:    6\' 2"  (1.88 m)   Physical Exam Constitutional: well- though tired-appearing man lying in bed, in no acute distress Pulmonary/Chest: normal work of breathing on room air Abdominal: non-distended MSK: normal bulk and tone, pain-limited extension at right elbow otherwise full ROM Neurological: alert & oriented x 3 Skin: Large area of erythema, swelling, and warmth of the RUE which overlies the elbow and extends proximally to mid upper arm and distally to mid forearm. Small healing/scabbed abrasion on the posterior aspect of the elbow, no purulence, no tightness  Pertinent Labs: CBC Latest Ref Rng & Units 10/25/2019 04/02/2018 02/21/2017  WBC 4.0 - 10.5 K/uL 10.3 7.9 6.5  Hemoglobin 13.0 - 17.0 g/dL 02/23/2017 49.6 75.9  Hematocrit 39 - 52 % 40.8 44.0 44.9  Platelets 150 - 400 K/uL 390 155 194     CMP Latest Ref Rng & Units 10/25/2019 04/02/2018 02/21/2017  Glucose 70 - 99 mg/dL 84 02/23/2017) 846(K)  BUN 6 - 20 mg/dL 9 10 12   Creatinine 0.61 - 1.24 mg/dL 599(J 5.70  Sodium 135 - 145 mmol/L 143 135 138  Potassium 3.5 - 5.1 mmol/L 3.4(L) 3.8 4.3  Chloride 98 - 111 mmol/L 106 98 98(L)  CO2 22 - 32 mmol/L 26 17(L) 23  Calcium 8.9 - 10.3 mg/dL 9.4 9.4 9.3  Total Protein 6.5 - 8.1 g/dL 6.6 7.3 7.0  Total Bilirubin 0.3 - 1.2 mg/dL 0.8 1.77) 2.8(H)  Alkaline Phos 38 - 126 U/L 61 49 49  AST 15 - 41 U/L 20 192(H) 57(H)  ALT 0 - 44 U/L 20 171(H) 42    Imaging: DG Elbow Complete Right  Result Date: 10/25/2019 CLINICAL DATA:  Swelling laterally EXAM: RIGHT ELBOW - COMPLETE 3+ VIEW COMPARISON:  None. FINDINGS: Diffuse soft tissue swelling. No acute bony abnormality. Specifically, no fracture, subluxation, or dislocation. No joint effusion. IMPRESSION: Diffuse soft tissue swelling.  No acute bony abnormality. Electronically Signed   By: 0.3(E M.D.   On: 10/25/2019 17:36   DG Chest Port 1 View  Result Date: 10/25/2019 CLINICAL DATA:  49 year old male with possible sepsis. EXAM: PORTABLE CHEST 1 VIEW COMPARISON:  CT Abdomen and Pelvis 01/04/2009. FINDINGS: Portable AP semi upright view at 1902 hours. Linear radiopaque object projecting along  the peripheral left upper lung may be external. There is also clothing button appearing artifact near the superior left mediastinum. Allowing for portable technique the lungs are clear. No pneumothorax or pleural effusion. Somewhat low lung volumes. Normal cardiac size and mediastinal contours. Visualized tracheal air column is within normal limits. No acute osseous abnormality identified. IMPRESSION: 1. Negative portable chest. 2. Presumed external (possibly clothing) artifact projecting over the left upper lung. Electronically Signed   By: Odessa Fleming M.D.   On: 10/25/2019 19:18    Assessment/Plan:   Principal Problem:   Nonpurulent cellulitis of  right arm Active Problems:   Alcohol use disorder, moderate, in early remission, dependence (HCC)   Patient Summary: Robert Norton is a 49 y.o. Robert Norton with past medical history of GERD, right lateral epicondylitis, and alcohol use disorder who presented with right elbow swelling and pain consistent with right elbow cellulitis and admitted due to concern for possible septic olecranon bursitis.  This is hospital day 1.   Right elbow cellulitis Possible right septic olecranon bursitis Treated with IV fluids and started on vancomycin and ceftriaxone in the ED. Tachypnea and tachycardia resolved. Patient is well-appearing with pain-limited extension at right elbow otherwise full range of motion. Overlying erythema and swelling persistent. Ortho consulted and obtained sample of fluid to evaluate for possible right olecranon bursitis. Will treat empirically with vancomycin and ceftriaxone until we have cultures back from joint aspiration. - Continue vancomycin and ceftriaxone - F/u R elbow joint aspirate culture - Blood culture pending, no growth <12 hours - Maintenance fulids - Ibuprofen 400 q6h PRN for mild to moderate pain, Oxycodone 5 mg for severe pain  Alcohol use disorder Last drink reportedly 10/16 pm. Blood alcohol 252 on admission. Patient denies history of alcohol withdrawal. No s/s alcohol withdrawal this morning. - thiamine 100 mg daily - Alcohol withdrawal prevention CIWA protocol with Ativan   Diet: Normal IVF: LR,100cc/hr VTE: Heparin Code: Full   Dispo: Anticipated discharge to Home in 1-2 days pending clinical improvement on antibiotics.   Please contact the on call pager after 5 pm and on weekends at [phone].  Alphonzo Severance, MD PGY-1 Internal Medicine Teaching Service Pager: [phone] 10/26/2019

## 2019-10-26 NOTE — Consult Note (Addendum)
Reason for Consult:Right elbow pain Referring Physician: [name] is an 49 y.o. male.  HPI: Robert Norton was admitted yesterday with right elbow swelling and pain. This began out of the blue on Saturday. He did scrape the elbow during a fall several days prior to that but otherwise no trauma noted. He notes he feels better today than yesterday. He had one similar episode about 5 years ago that required some sort of drainage. He denies fevers, chills, sweats, N/V. 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  Past Medical History:  Diagnosis Date  . Abdominal cramps   . Acute meniscal tear of left knee   . Chest pain   . Cough   . Diaphoresis   . Diarrhea   . Elbow pain   . Elevated liver enzymes   . Epicondylitis, lateral   . ETOH abuse   . GERD without esophagitis   . Left knee pain   . Mallet fracture, closed   . Pain in left shin   . Shoulder pain   . Sinusitis   . URI (upper respiratory infection)     Past Surgical History:  Procedure Laterality Date  . APPENDECTOMY    . KNEE SURGERY    . PILONIDAL CYST DRAINAGE      Family History  Problem Relation Age of Onset  . Diabetes Mother     Social History:  reports that he has never smoked. His smokeless tobacco use includes chew. He reports current alcohol use. He reports that he does not use drugs.  Allergies:  Allergies  Allergen Reactions  . Penicillins Hives and Rash    Medications: I have reviewed the patient's current medications.  Results for orders placed or performed during the hospital encounter of 10/25/19 (from the past 48 hour(s))  Lactic acid, plasma     Status: Abnormal   Collection Time: 10/25/19  4:21 PM  Result Value Ref Range   Lactic Acid, Venous 2.5 (HH) 0.5 - 1.9 mmol/L    Comment: CRITICAL RESULT CALLED TO, READ BACK BY AND VERIFIED WITH: KAREN NEWNAN [email or web address]  ON 10.17.21 BY TCALDWELL MT. Performed at [facility], [address], [place], [place] [address]   Comprehensive  metabolic panel     Status: Abnormal   Collection Time: 10/25/19  4:21 PM  Result Value Ref Range   Sodium 143 135 - 145 mmol/L   Potassium 3.4 (L) 3.5 - 5.1 mmol/L   Chloride 106 98 - 111 mmol/L   CO2 26 22 - 32 mmol/L   Glucose, Bld 84 70 - 99 mg/dL    Comment: Glucose reference range applies only to samples taken after fasting for at least 8 hours.   BUN 9 6 - 20 mg/dL   Creatinine, Ser 6.04 0.61 - 1.24 mg/dL   Calcium 9.4 8.9 - 54.0 mg/dL   Total Protein 6.6 6.5 - 8.1 g/dL   Albumin 4.1 3.5 - 5.0 g/dL   AST 20 15 - 41 U/L   ALT 20 0 - 44 U/L   Alkaline Phosphatase 61 38 - 126 U/L   Total Bilirubin 0.8 0.3 - 1.2 mg/dL   GFR, Estimated >98 >11 mL/min   Anion gap 11 5 - 15    Comment: Performed at [facility], [address] [place], [place] [address]  CBC with Differential     Status: None   Collection Time: 10/25/19  4:21 PM  Result Value Ref Range   WBC 10.3  4.0 - 10.5 K/uL   RBC 4.27 4.22 - 5.81 MIL/uL   Hemoglobin 13.9 13.0 - 17.0 g/dL   HCT 16.140.8 39 - 52 %   MCV 95.6 80.0 - 100.0 fL   MCH 32.6 26.0 - 34.0 pg   MCHC 34.1 30.0 - 36.0 g/dL   RDW 09.614.2 04.511.5 - 40.915.5 %   Platelets 390 150 - 400 K/uL   nRBC 0.0 0.0 - 0.2 %   Neutrophils Relative % 75 %   Neutro Abs 7.7 1.7 - 7.7 K/uL   Lymphocytes Relative 16 %   Lymphs Abs 1.7 0.7 - 4.0 K/uL   Monocytes Relative 8 %   Monocytes Absolute 0.9 0.1 - 1.0 K/uL   Eosinophils Relative 0 %   Eosinophils Absolute 0.0 0.0 - 0.5 K/uL   Basophils Relative 1 %   Basophils Absolute 0.1 0.0 - 0.1 K/uL   Immature Granulocytes 0 %   Abs Immature Granulocytes 0.03 0.00 - 0.07 K/uL    Comment: Performed at [facility], [address] [place], [place] [address]  Lactic acid, plasma     Status: None   Collection Time: 10/25/19  6:48 PM  Result Value Ref Range   Lactic Acid, Venous 1.9 0.5 - 1.9 mmol/L    Comment: Performed at [facility], [address] [place], [place] [address]  Lactic acid, plasma     Status:  Abnormal   Collection Time: 10/25/19  7:13 PM  Result Value Ref Range   Lactic Acid, Venous 2.4 (HH) 0.5 - 1.9 mmol/L    Comment: CRITICAL RESULT CALLED TO, READ BACK BY AND VERIFIED WITH: RN Leonard SchwartzB [facility] AT 2004 10/25/19 BY L BENFIELD Performed at [facility], [address] [place], [place] [address]   Protime-INR     Status: None   Collection Time: 10/25/19  7:13 PM  Result Value Ref Range   Prothrombin Time 12.5 11.4 - 15.2 seconds   INR 1.0 0.8 - 1.2    Comment: (NOTE) INR goal varies based on device and disease states. Performed at [facility], [address] [place], [place] [address]   APTT     Status: None   Collection Time: 10/25/19  7:13 PM  Result Value Ref Range   aPTT 34 24 - 36 seconds    Comment: Performed at [facility], [address] [place], [place] [address]  Respiratory Panel by RT PCR (Flu A&B, Covid) - Nasopharyngeal Swab     Status: None   Collection Time: 10/25/19  7:13 PM   Specimen: Nasopharyngeal Swab  Result Value Ref Range   SARS Coronavirus 2 by RT PCR NEGATIVE NEGATIVE    Comment: (NOTE) SARS-CoV-2 target nucleic acids are NOT DETECTED.  The SARS-CoV-2 RNA is generally detectable in upper respiratoy specimens during the acute phase of infection. The lowest concentration of SARS-CoV-2 viral copies this assay can detect is 131 copies/mL. A negative result does not preclude SARS-Cov-2 infection and should not be used as the sole basis for treatment or other patient management decisions. A negative result may occur with  improper specimen collection/handling, submission of specimen other than nasopharyngeal swab, presence of viral mutation(s) within the areas targeted by this assay, and inadequate number of viral copies (<131 copies/mL). A negative result must be combined with clinical observations, patient history, and epidemiological information. The expected result is Negative.  Fact Sheet for Patients:   https://www.moore.com/https://www.fda.gov/media/142436/download  Fact Sheet for Healthcare Providers:  https://www.young.biz/  This test is no t yet approved or cleared by the [place] and  has been authorized for detection and/or diagnosis of SARS-CoV-2 by FDA under an Emergency Use Authorization (EUA). This EUA will remain  in effect (meaning this test can be used) for the duration of the COVID-19 declaration under Section 564(b)(1) of the Act, 21 U.S.C. section 360bbb-3(b)(1), unless the authorization is terminated or revoked sooner.     Influenza A by PCR NEGATIVE NEGATIVE   Influenza B by PCR NEGATIVE NEGATIVE    Comment: (NOTE) The Xpert Xpress SARS-CoV-2/FLU/RSV assay is intended as an aid in  the diagnosis of influenza from Nasopharyngeal swab specimens and  should not be used as a sole basis for treatment. Nasal washings and  aspirates are unacceptable for Xpert Xpress SARS-CoV-2/FLU/RSV  testing.  Fact Sheet for Patients: https://www.moore.com/  Fact Sheet for Healthcare Providers: https://www.young.biz/  This test is not yet approved or cleared by the [place] FDA and  has been authorized for detection and/or diagnosis of SARS-CoV-2 by  FDA under an Emergency Use Authorization (EUA). This EUA will remain  in effect (meaning this test can be used) for the duration of the  Covid-19 declaration under Section 564(b)(1) of the Act, 21  U.S.C. section 360bbb-3(b)(1), unless the authorization is  terminated or revoked. Performed at [facility], [address] [place], [place] [address]   Ethanol     Status: Abnormal   Collection Time: 10/25/19  7:13 PM  Result Value Ref Range   Alcohol, Ethyl (B) 252 (H) <10 mg/dL    Comment: (NOTE) Lowest detectable limit for serum alcohol is 10 mg/dL.  For medical purposes only. Performed at [facility], [address], [place], [place] [address]   Blood Culture  (routine x 2)     Status: None (Preliminary result)   Collection Time: 10/25/19  7:25 PM   Specimen: BLOOD RIGHT ARM  Result Value Ref Range   Specimen Description BLOOD RIGHT ARM    Special Requests      BOTTLES DRAWN AEROBIC AND ANAEROBIC Blood Culture adequate volume   Culture      NO GROWTH < 12 HOURS Performed at [facility], [address] [place], [place] [address]    Report Status PENDING   Urinalysis, Routine w reflex microscopic Urine, Clean Catch     Status: Abnormal   Collection Time: 10/25/19  7:43 PM  Result Value Ref Range   Color, Urine COLORLESS (A) YELLOW   APPearance CLEAR CLEAR   Specific Gravity, Urine 1.003 (L) 1.005 - 1.030   pH 6.0 5.0 - 8.0   Glucose, UA NEGATIVE NEGATIVE mg/dL   Hgb urine dipstick NEGATIVE NEGATIVE   Bilirubin Urine NEGATIVE NEGATIVE   Ketones, ur NEGATIVE NEGATIVE mg/dL   Protein, ur NEGATIVE NEGATIVE mg/dL   Nitrite NEGATIVE NEGATIVE   Leukocytes,Ua NEGATIVE NEGATIVE    Comment: Performed at [facility], [address] [place], [place] [address]  Blood Culture (routine x 2)     Status: None (Preliminary result)   Collection Time: 10/25/19  9:25 PM   Specimen: BLOOD LEFT ARM  Result Value Ref Range   Specimen Description BLOOD LEFT ARM    Special Requests      BOTTLES DRAWN AEROBIC AND ANAEROBIC Blood Culture adequate volume   Culture      NO GROWTH < 12 HOURS Performed at [facility], [address] [place], [place]  [address]    Report Status PENDING   Lactic acid, plasma     Status: None   Collection Time: 10/25/19 10:05 PM  Result Value Ref Range   Lactic Acid, Venous 1.5 0.5 - 1.9 mmol/L    Comment: Performed at [facility], [address] [place], [place] [address]  Comprehensive metabolic panel     Status: Abnormal   Collection Time: 10/26/19  7:20 AM  Result Value Ref Range   Sodium 140 135 - 145 mmol/L   Potassium 3.7 3.5 - 5.1 mmol/L   Chloride 105 98 - 111 mmol/L   CO2 25 22 - 32  mmol/L   Glucose, Bld 104 (H) 70 - 99 mg/dL    Comment: Glucose reference range applies only to samples taken after fasting for at least 8 hours.   BUN 6 6 - 20 mg/dL   Creatinine, Ser 2.54 0.61 - 1.24 mg/dL   Calcium 9.2 8.9 - 27.0 mg/dL   Total Protein 6.0 (L) 6.5 - 8.1 g/dL   Albumin 3.6 3.5 - 5.0 g/dL   AST 18 15 - 41 U/L   ALT 16 0 - 44 U/L   Alkaline Phosphatase 56 38 - 126 U/L   Total Bilirubin 0.7 0.3 - 1.2 mg/dL   GFR, Estimated >62 >37 mL/min   Anion gap 10 5 - 15    Comment: Performed at [facility], [address], [place], [place] [address]  CBC     Status: Abnormal   Collection Time: 10/26/19  7:20 AM  Result Value Ref Range   WBC 7.8 4.0 - 10.5 K/uL   RBC 3.95 (L) 4.22 - 5.81 MIL/uL   Hemoglobin 13.2 13.0 - 17.0 g/dL   HCT 51.7 (L) 39 - 52 %   MCV 94.7 80.0 - 100.0 fL   MCH 33.4 26.0 - 34.0 pg   MCHC 35.3 30.0 - 36.0 g/dL   RDW 61.6 07.3 - 71.0 %   Platelets 327 150 - 400 K/uL   nRBC 0.0 0.0 - 0.2 %    Comment: Performed at [facility], [address], [place], [place] [address]    DG Elbow Complete Right  Result Date: 10/25/2019 CLINICAL DATA:  Swelling laterally EXAM: RIGHT ELBOW - COMPLETE 3+ VIEW COMPARISON:  None. FINDINGS: Diffuse soft tissue swelling. No acute bony abnormality. Specifically, no fracture, subluxation, or dislocation. No joint effusion. IMPRESSION: Diffuse soft tissue swelling.  No acute bony abnormality. Electronically Signed   By: Charlett Nose M.D.   On: 10/25/2019 17:36   DG Chest Port 1 View  Result Date: 10/25/2019 CLINICAL DATA:  49 year old male with possible sepsis. EXAM: PORTABLE CHEST 1 VIEW COMPARISON:  CT Abdomen and Pelvis 01/04/2009. FINDINGS: Portable AP semi upright view at 1902 hours. Linear radiopaque object projecting along the peripheral left upper lung may be external. There is also clothing button appearing artifact near the superior left mediastinum. Allowing for portable technique the lungs are clear. No  pneumothorax or pleural effusion. Somewhat low lung volumes. Normal cardiac size and mediastinal contours. Visualized tracheal air column is within normal limits. No acute osseous abnormality identified. IMPRESSION: 1. Negative portable chest. 2. Presumed external (possibly clothing) artifact projecting over the left upper lung. Electronically Signed   By: Odessa Fleming M.D.   On: 10/25/2019 19:18    Review of Systems  Constitutional: Negative for chills, diaphoresis and fever.  HENT: Negative for ear discharge, ear pain, hearing loss and tinnitus.  Eyes: Negative for photophobia and pain.  Respiratory: Negative for cough and shortness of breath.   Cardiovascular: Negative for chest pain.  Gastrointestinal: Negative for abdominal pain, nausea and vomiting.  Genitourinary: Negative for dysuria, flank pain, frequency and urgency.  Musculoskeletal: Positive for arthralgias (Right elbow). Negative for back pain, myalgias and neck pain.  Neurological: Negative for dizziness and headaches.  Hematological: Does not bruise/bleed easily.  Psychiatric/Behavioral: The patient is not nervous/anxious.    Blood pressure 121/75, pulse 81, temperature 98 F (36.7 C), temperature source Oral, resp. rate 20, height 6\' 2"  (1.88 m), weight 90.7 kg, SpO2 96 %. Physical Exam Constitutional:      General: He is not in acute distress.    Appearance: He is well-developed. He is not diaphoretic.  HENT:     Head: Normocephalic and atraumatic.  Eyes:     General: No scleral icterus.       Right eye: No discharge.        Left eye: No discharge.     Conjunctiva/sclera: Conjunctivae normal.  Cardiovascular:     Rate and Rhythm: Normal rate and regular rhythm.  Pulmonary:     Effort: Pulmonary effort is normal. No respiratory distress.  Musculoskeletal:     Cervical back: Normal range of motion.     Comments: Right shoulder, elbow, wrist, digits- no skin wounds, elbow bursa swollen and boggy, erythema surrounding  elbow, able to AROM nearly full range, no instability, no blocks to motion  Sens  Ax/R/M/U intact  Mot   Ax/ R/ PIN/ M/ AIN/ U intact  Rad 2+  Skin:    General: Skin is warm and dry.  Neurological:     Mental Status: He is alert.  Psychiatric:        Behavior: Behavior normal.     Assessment/Plan: Right olecranon bursitis -- Will obtain a sample of fluid to help guide abx use. Given overnight improvement and pt's desire to avoid surgery if possible will let eat today and watch expectantly.    , PA-C Orthopedic Surgery [phone] 10/26/2019, 9:23 AM

## 2019-10-26 NOTE — Progress Notes (Addendum)
Pharmacy Antibiotic Note  Robert Norton is a 49 y.o. male admitted on 10/25/2019 with cellulitis.  Pharmacy has been consulted for vancomycin dosing.   Vanc/ceftriaxone have been optimized to cefazolin. Team would like to expand to broad spectrum for now until more data from sample fluid.   Plan: Dc cefazolin Vanc 1.5g IV q12 Ceftriaxone 2g IV q24 F/u with culture  Height: 6\' 2"  (188 cm) Weight: 90.7 kg (200 lb) IBW/kg (Calculated) : 82.2  Temp (24hrs), Avg:98.1 F (36.7 C), Min:98 F (36.7 C), Max:98.1 F (36.7 C)  Recent Labs  Lab 10/25/19 1621 10/25/19 1848 10/25/19 1913 10/25/19 2205 10/26/19 0720  WBC 10.3  --   --   --  7.8  CREATININE 0.96  --   --   --  0.76  LATICACIDVEN 2.5* 1.9 2.4* 1.5  --     Estimated Creatinine Clearance: 131.3 mL/min (by C-G formula based on SCr of 0.76 mg/dL).    Allergies  Allergen Reactions  . Penicillins Hives and Rash    10/28/19, PharmD, BCIDP, AAHIVP, CPP Infectious Disease Pharmacist 10/26/2019 8:43 AM

## 2019-10-27 LAB — URINE CULTURE: Culture: NO GROWTH

## 2019-10-27 MED ORDER — CEFDINIR 300 MG PO CAPS: 300.0000 mg | ORAL_CAPSULE | Freq: Two times a day (BID) | ORAL | 0 refills | Status: AC

## 2019-10-27 NOTE — Discharge Instructions (Signed)
Mr. Robert Norton,   It was a pleasure taking care of you. You were admitted to the hospital and treated for cellulitis and bursitis. The orthopedic doctor took a sample from your elbow which was consistent with inflammation and not infection. It is very important that you complete your course of antibiotics for your skin infection (cellulitis). We have prescribed 6 days of cefdinir (Omnicef) which you will start today and take twice daily for a total of 6 days.  We would like for you to follow-up with your primary care doctor in the next week or so to evaluate your progress. If the redness, pain, and/or swelling worsen, contact your doctor sooner.  Also, if you haven't gotten your COVID-19 vaccination, we highly recommend it.   COVID-19 Vaccine:  - The vaccines are SAFE.They went through all the required stages of clinical trials, and extensive testing and monitoring have shown that these vaccines are safe and effective. - The vaccines are EFFECTIVE. They lower your chances of getting and spreading the virus. They are proven to reduce your chance of severe illness, hospitalization, and death from COVID-19. - Getting vaccinated protects you, your loved ones, and people at risk for severe illness from COVID-19. - You can get vaccinated at any major pharmacy. If you are interested in setting up an appointment to get vaccinated through [facility], you can call the [facility] COVID-19 hotline at [phone].  Take care!

## 2019-10-27 NOTE — Discharge Summary (Signed)
Name: Robert Norton MRN: [id number] DOB: Apr 03, 1970 49 y.o. PCP: Patient, No Pcp Per  Date of Admission: 10/25/2019  4:04 PM Date of Discharge: 10/27/19 Attending Physician: Gust Rung, DO  Discharge Diagnosis:  1. Nonpurulent cellulitis of right arm 2. Right olecranon bursitis 3. Alcohol use disorder, moderate, in early remission, dependence  Discharge Medications: Allergies as of 10/27/2019      Reactions   Penicillins Hives, Rash      Medication List    STOP taking these medications   chlordiazePOXIDE 25 MG capsule Commonly known as: LIBRIUM     TAKE these medications   cefdinir 300 MG capsule Commonly known as: OMNICEF Take 1 capsule (300 mg total) by mouth 2 (two) times daily for 6 days.   cloNIDine 0.1 MG tablet Commonly known as: CATAPRES Take 0.1 mg by mouth 3 (three) times daily.   escitalopram 5 MG tablet Commonly known as: LEXAPRO Take 5 mg by mouth daily.   gabapentin 300 MG capsule Commonly known as: NEURONTIN Take 300 mg by mouth in the morning and at bedtime.   naltrexone 50 MG tablet Commonly known as: DEPADE Take 50 mg by mouth daily.       Disposition and follow-up:   Robert Norton was discharged from [facility] in Good condition.  At the hospital follow up visit please address:  1.    Nonpurulent cellulitis of right arm Right olecranon bursitis - Patient discharged home with prescription for 6 days of cefdinir 300 mg twice daily. Ensure completed course and resolution of cellulitis.  Alcohol use disorder - Patient reported having a shot of alcohol the evening prior to this admission yet also noted he was 60 days sober. Check in regarding his alcohol use and adherence to medications.  2.  Labs / imaging needed at time of follow-up: none  3.  Pending labs/ test needing follow-up: none  Follow-up Appointments:   Instructed to follow-up with a primary care physician in 1 week.  Hospital Course by  problem list:  Robert Norton is a 49 year old gentleman with past medical history of GERD, right lateral epicondylitis, and alcohol use disorder who presented with right elbow swelling and pain consistent with right elbow cellulitis and admitted due to concern for possible septic olecranon bursitis.   Nonpurulent cellulitis of right arm Right olecranon bursitis Robert Norton presented with swelling and redness, associated with some pain with movement of his right elbow. On presentation to the emergency department he was found to be tachycardic and tachypneic. Lactic acid was mildly elevated prompting concern for sepsis due to right elbow cellulitis. Blood cultures were drawn. He was treated with IV fluids and started on vancomycin and ceftriaxone due to possible septic olecranon bursitis. His lactic acid decreased, and his tachycardia and tachypnea resolved with fluids. Orthopedics was consulted and performed joint aspirate. After 1 day of IV antibiotics, he was resting comfortably and able to move his elbow almost to full range of motion with significant improvement in redness and swelling. Blood cultures with no growth to date. Cell count with 480 WBC and 82 neutrophils, consistent with inflammation and not septic joint. Findings more consistent with nonpurulent cellulitis than systemic sepsis. Patient discharged with prescription for 6-day course of cefdinir (to complete total 7-day course of antibiotics) and instructed on return precautions.  Discharge Vitals:   BP 114/75 (BP Location: Left Arm)   Pulse 80   Temp 98.2 F (36.8 C) (Oral)   Resp 18  Ht 6\' 2"  (1.88 m)   Wt 90.7 kg   SpO2 96%   BMI 25.68 kg/m    Discharge Instructions: Discharge Instructions    Call MD for:  difficulty breathing, headache or visual disturbances   Complete by: As directed    Call MD for:  persistant dizziness or light-headedness   Complete by: As directed    Call MD for:  redness, tenderness, or signs of  infection (pain, swelling, redness, odor or green/yellow discharge around incision site)   Complete by: As directed    Call MD for:  severe uncontrolled pain   Complete by: As directed    No wound care   Complete by: As directed       Robert Norton,   It was a pleasure taking care of you. You were admitted to the hospital and treated for cellulitis and bursitis. The orthopedic doctor took a sample from your elbow which was consistent with inflammation and not infection. It is very important that you complete your course of antibiotics for your skin infection (cellulitis). We have prescribed 6 days of cefdinir (Omnicef) which you will start today and take twice daily for a total of 6 days.  We would like for you to follow-up with your primary care doctor in the next week or so to evaluate your progress. If the redness, pain, and/or swelling worsen, contact your doctor sooner.  Also, if you haven't gotten your COVID-19 vaccination, we highly recommend it.   COVID-19 Vaccine:  - The vaccines are SAFE.They went through all the required stages of clinical trials, and extensive testing and monitoring have shown that these vaccines are safe and effective. - The vaccines are EFFECTIVE. They lower your chances of getting and spreading the virus. They are proven to reduce your chance of severe illness, hospitalization, and death from COVID-19. - Getting vaccinated protects you, your loved ones, and people at risk for severe illness from COVID-19. - You can get vaccinated at any major pharmacy. If you are interested in setting up an appointment to get vaccinated through [facility], you can call the [facility] COVID-19 hotline at [phone].  Take care!   Signed: [phone], MD 10/27/2019, 11:14 AM   Pager: [phone]

## 2019-10-27 NOTE — Progress Notes (Signed)
HD#1 Subjective:   Patient admitted yesterday evening. Did not require any pain medication.  Evaluated at bedside during rounds. Patient states he is doing well, states his arm redness and pain are much improved. Notes some persistent swelling in the arm. Sensation and movement are intact. Can better extend his arm today. States he would like to go home, expresses relief regarding results of synovial fluid cell counts.  Objective:   Vital signs in last 24 hours: Vitals:   10/26/19 1238 10/26/19 1713 10/27/19 0051 10/27/19 0527  BP: 122/78 116/77 112/79 114/75  Pulse: 78  80 80  Resp: 20 20 18 18   Temp: 98.9 F (37.2 C) 98.2 F (36.8 C) 98.3 F (36.8 C) 98.2 F (36.8 C)  TempSrc: Oral Oral Oral Oral  SpO2: 100% 95% 95% 96%  Weight:      Height:       Physical Exam Constitutional: well-appearing man sitting upright in bed, in no acute distress MSK: normal bulk and tone, improved extension at right elbow Skin: Significantly smaller area of erythema and swelling of the RUE. Less warm. Small healing/scabbed abrasion on the posterior aspect of the elbow, no purulence, no tightness.  Pertinent Labs: CBC Latest Ref Rng & Units 10/26/2019 10/25/2019 04/02/2018  WBC 4.0 - 10.5 K/uL 7.8 10.3 7.9  Hemoglobin 13.0 - 17.0 g/dL 04/04/2018 89.2 11.9  Hematocrit 39 - 52 % 37.4(L) 40.8 44.0  Platelets 150 - 400 K/uL 327 390 155    CMP Latest Ref Rng & Units 10/26/2019 10/25/2019 04/02/2018  Glucose 70 - 99 mg/dL 04/04/2018) 84 408(X)  BUN 6 - 20 mg/dL 6 9 10   Creatinine 0.61 - 1.24 mg/dL 448(J 8.56  Sodium 135 - 145 mmol/L 140 143 135  Potassium 3.5 - 5.1 mmol/L 3.7 3.4(L) 3.8  Chloride 98 - 111 mmol/L 105 106 98  CO2 22 - 32 mmol/L 25 26 17(L)  Calcium 8.9 - 10.3 mg/dL 9.2 9.4 9.4  Total Protein 6.5 - 8.1 g/dL 6.0(L) 6.6 7.3  Total Bilirubin 0.3 - 1.2 mg/dL 0.7 0.8 3.14)  Alkaline Phos 38 - 126 U/L 56 61 49  AST 15 - 41 U/L 18 20 192(H)  ALT 0 - 44 U/L 16 20 171(H)    Imaging: No  results found.  Assessment/Plan:   Principal Problem:   Nonpurulent cellulitis of right arm Active Problems:   Alcohol use disorder, moderate, in early remission, dependence (HCC)   Patient Summary: Robert Norton is a 49 y.o. gentleman with past medical history of GERD, right lateral epicondylitis, and alcohol use disorder who presented with right elbow swelling and pain consistent with right elbow cellulitis and admitted due to concern for possible septic olecranon bursitis.  This is hospital day 2.  Right elbow cellulitis Right olecranon bursitis Significant improvement in erythema, swelling, and warmth since initiation of vancomycin and ceftriaxone. Joint aspirate culture with no WBC or organisms. Culture pending. Cell count with 480 WBC and 82 neutrophils, consistent with inflammation and not septic joint. Patient stable for discharge home with prescription for cefdinir to complete 7-day course of antibiotics for non-purulent skin infection. - s/p 1 day IV vancomycin and ceftriaxone - Discharge home with 6 days of cefdinir - Blood culture no growth to date  Alcohol use disorder No signs or symptoms of alcohol withdrawal during this admission. Patient to be discharged home on home medications.  Diet: Normal VTE: Heparin Code: Full  Dispo: Anticipated discharge to Home today.  Please contact the on  call pager after 5 pm and on weekends at [phone].  Alphonzo Severance, MD PGY-1 Internal Medicine Teaching Service Pager: [phone] 10/27/2019

## 2019-10-28 ENCOUNTER — Other Ambulatory Visit: Payer: Self-pay

## 2019-10-28 ENCOUNTER — Other Ambulatory Visit (HOSPITAL_COMMUNITY): Payer: BLUE CROSS/BLUE SHIELD | Attending: Psychiatry | Admitting: Licensed Clinical Social Worker

## 2019-10-28 DIAGNOSIS — F341 Dysthymic disorder: Secondary | ICD-10-CM | POA: Diagnosis not present

## 2019-10-28 DIAGNOSIS — F1020 Alcohol dependence, uncomplicated: Secondary | ICD-10-CM | POA: Diagnosis not present

## 2019-10-28 DIAGNOSIS — Z811 Family history of alcohol abuse and dependence: Secondary | ICD-10-CM | POA: Diagnosis not present

## 2019-10-28 DIAGNOSIS — F1994 Other psychoactive substance use, unspecified with psychoactive substance-induced mood disorder: Secondary | ICD-10-CM | POA: Diagnosis not present

## 2019-10-28 NOTE — Progress Notes (Signed)
Psychiatric Initial Adult Assessment   Patient Identification: Robert Norton MRN:  [id number] Date of Evaluation: 10/28/2019 Referral Source: GF Chief Complaint:   Visit Diagnosis:    ICD-10-CM   1. Alcohol use disorder, severe, dependence (HCC)  F10.20   2. Family history of alcoholism in paternal grandfather  Z81.1   3. Family history of alcoholism in grandfather  Z81.1   4. 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 Substance induced mood disorder (HCC)  F19.94     History of Present Illness:    49 y/o divorced Caucasian male referred 10/16/2019 by girlfriend for CDIOP following relapse after completion of residential program for alcohol abuse. Pt actually entered program 10/28/19 due to hospitalization:                         Date of Admission: 10/25/2019  4:04 PM                         Date of Discharge: 10/27/19                         Attending Physician: Gust Rung, DO                         Discharge Diagnosis:                         1. Nonpurulent cellulitis of right arm                         2. Right olecranon bursitis                         3. Alcohol use disorder, moderate, in early remission, dependence                             Patient reported having a shot of alcohol the evening prior to this admission yet also was 60 days                             sober. Results for HARVIN, KONICEK (MRN [id number]) as of 11/26/2019 17:06  Ref. Range 10/25/2019 19:13  Alcohol, Ethyl (B) Latest Ref Range: <10 mg/dL 355 (H) More than 1 drink    Associated Signs/Symptoms: DSM V SUD Criteria 10/11 + Severe dependence ASAM's:  Six Dimensions of Multidimensional Assessment Dimension 1:  Acute Intoxication and/or Withdrawal Potential:   Dimension 1:  Description of individual's past and current experiences of substance use and withdrawal: recently week binge  Dimension 2:  Biomedical Conditions and Complications:    Dimension 2:  Description of patient's biomedical conditions and  complications: client denies medical concerns, recent physical and bloodwork per client with no problems voiced from PCP; hx hand tremors after not drinking  Dimension 3:  Emotional, Behavioral, or Cognitive Conditions and Complications:  Dimension 3:  Description of emotional, behavioral, or cognitive conditions and complications: on anti-depressant; denies any problematic mood symptoms  Dimension 4:  Readiness to Change:  Dimension 4:  Description of Readiness to Change criteria: minimizes use and consequences of use  Dimension 5:  Relapse, Continued use, or Continued Problem Potential:  Dimension 5:  Relapse, continued use, or continued  problem potential critiera description: mutlipel relapses since completing residential in may 2021; longest sobriety 49 days  Dimension 6:  Recovery/Living Environment:  Dimension 6:  Recovery/Iiving environment criteria description: girlfriend supportive, has children 1/2 the week and does not drink when they're in his custody  ASAM Severity Score: ASAM's Severity Rating Score: 10  ASAM Recommended Level of Treatment: ASAM Recommended Level of Treatment: Level II Intensive Outpatient Treatment   AUDIT score of 21  Depression Symptoms: Depression: None (reports none since father passed away(Jan 04-26-18) but has stayed on lexapro prescribed by PCP since) PHQ9 score of 7 (Hypo) Manic Symptoms:  None Anxiety Symptoms:  Denies GAD7 score of 0 Psychotic Symptoms:  None PTSD Symptoms: Client denies -death of father appears to have had traumatic effect (Depression)January 2020  Past Psychiatric History:  2015/04/26 first note in EHR for acute stress reaction and self medicating  ED early 26-Apr-2018 'worried about w/d symtoms' and seizure.  [facility] in May 2021 Following discharge client was [facility] with [facility] for medication management and seen via telehealth by a doctor out of  [place]  Previous Psychotropic Medications: Yes   Substance Abuse History in the last 12 months:  Yes.    Consequences of Substance Abuse: Medical Consequences:  Withdrwals/seizure Legal Consequences:   None reported Family Consequences:  Divorce/Seperation/Strained relationships Blackouts:  Yes DT's: NA Withdrawal Symptoms:   Tremors  Past Medical History:  Past Medical History:  Diagnosis Date  . Abdominal cramps   . Acute meniscal tear of left knee   . Chest pain   . Cough   . Diaphoresis   . Diarrhea   . Elbow pain   . Elevated liver enzymes   . Epicondylitis, lateral   . ETOH abuse   . GERD without esophagitis   . Left knee pain   . Mallet fracture, closed   . Pain in left shin   . Shoulder pain   . Sinusitis   . URI (upper respiratory infection)     Past Surgical History:  Procedure Laterality Date  . APPENDECTOMY    . KNEE SURGERY    . PILONIDAL CYST DRAINAGE      Family Psychiatric History:  Father- functional alcoholic Paternal grandfather  Pt identifies as a 'raging alcoholic'  Family History:  Family History  Problem Relation Age of Onset  . Diabetes Mother     Social History:  Social History   Socioeconomic History  . Marital status: girlfriend of 2.5 years     Spouse name:   . Number of children: twin boys age 77, and daughter age 41.Client's girlfriend of 2.5 years also has twin 22 y/o sons.  . Years of education: 14.5  . Highest education level: Some college  Occupational History  . 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 Smoking status: Never Smoker  . Smokeless tobacco: Current User    Types: Chew  . Tobacco comment: 2 tins per day for 20 years  Vaping Use  . Vaping Use: Never used  Substance and Sexual Activity  .  Alcohol use: Yes    Comment: describes self as former alcoholic; drinks occ with loss of control and relationship problems was unable to see his children for around 2 weeks following wife's allegations of child endangerment for drinking and driving.  . Drug use: No  . Sexual activity: GF  Other Topics Concern  . See [facility]  Social History Narrative  .    Social Determinants of Health   Financial Resource Strain:   . Difficulty of Paying Living Expenses: No  Food Insecurity:   . Worried About Programme researcher, broadcasting/film/video in the Last Year: No  . Ran Out of Food in the Last Year: No  Transportation Needs:   . Lack of Transportation (Medical): No  . Lack of Transportation (Non-Medical): No  Physical Activity:   . Days of Exercise per Week: Do You Exercise?: No  . Minutes of Exercise per Session: NA  Stress:   . Feeling of Stress :Family conflict, Relationship, Work  Social Connections:   . Frequency of Communication with Friends and Family: Not on file  . Frequency of Social Gatherings with Friends and Family: Not on file  . Attends Religious Services: Not on file  . Active Member of Clubs or Organizations: Not on file  . Attends Banker Meetings: Not on file  . Marital Status: Not on file   Allergies:   Allergies  Allergen Reactions  . Penicillins Hives and Rash    Metabolic Disorder Labs:  results found for: HGBA1C, MPG Results for MURAD, STAPLES (MRN [id number]) as of 11/26/2019 17:06  Ref. Range 10/26/2019 10:18 10/26/2019 15:29 11/12/2019 13:53 11/12/2019 16:03 11/12/2019 18:29  Glucose Latest Ref Range: 70 - 99 mg/dL   053 (H)     No current results found for: CHOL, TRIG, HDL, CHOLHDL, VLDL, LDLCALC 2018 normal No current results found for: TSH  2017/2019 results normal  Results for LOY, MCCARTT (MRN [id number]) as of 11/26/2019 17:06  Ref. Range 10/25/2019 19:13  Alcohol, Ethyl (B) Latest Ref Range: <10 mg/dL 790 (H) Diagnostic for tolerance/ alcoholism   Therapeutic Level Labs:NA  Current Medications: Current Outpatient Medications  Medication Sig Dispense Refill  . cloNIDine (CATAPRES) 0.1 MG tablet Take 0.1 mg by mouth 3 (three) times daily.     . Disulfiram 500 MG TABS Take 1 tablet by mouth daily.    Marland Kitchen escitalopram (LEXAPRO) 5 MG tablet Take 5 mg by mouth daily.    Marland Kitchen gabapentin (NEURONTIN) 300 MG capsule Take 300 mg by mouth in the morning and at bedtime.    . hydrOXYzine (ATARAX/VISTARIL) 25 MG tablet Take 25 mg by mouth 4 (four) times daily.    . naltrexone (DEPADE) 50 MG tablet Take 50 mg by mouth daily.     No current facility-administered medications for this visit.    Musculoskeletal: Strength & Muscle Tone: within normal limits Gait & Station: normal Patient leans: N/A  Psychiatric Specialty Exam: Review of Systems  Constitutional: Positive for activity change (elbow wrapped).  HENT: Negative.   Eyes: Negative.   Respiratory: Negative.   Cardiovascular: Negative.   Gastrointestinal: Negative.   Endocrine: Positive for cold intolerance. Negative for heat intolerance, polydipsia, polyphagia and polyuria.  Genitourinary: Negative.   Musculoskeletal: Positive for arthralgias (RT OLECRANON BURSITIS) and joint swelling (RT ELBOW). Negative for back pain, gait problem, myalgias and neck stiffness.  Skin: Positive for color change (elbow). Negative for pallor, rash and wound (rt elbow).  Allergic/Immunologic: Negative.   Neurological: Positive for tremors (alcohol  withdrawal) and seizures (Hx + probable ETOH related). Negative for dizziness, syncope, facial asymmetry, speech difficulty, weakness, light-headedness, numbness and headaches.  Hematological: Negative.   Psychiatric/Behavioral: Positive for  agitation (alcohol use related denials), behavioral problems (alcohol use) and dysphoric mood. Negative for confusion, decreased concentration, hallucinations, self-injury, sleep disturbance and suicidal ideas. The patient is nervous/anxious (tremulous). The patient is not hyperactive.     There were no vitals taken for this visit.There is no height or weight on file to calculate BMI.  General Appearance: Casual and Well Groomed  Eye Contact:  Good  Speech:  Clear and Coherent  Volume:  Normal  Mood:  Euthymic  Affect:  Congruent  Thought Process:  Coherent and Descriptions of Associations: Intact  Orientation:  Full (Time, Place, and Person)  Thought Content:  WDL and Obsessions  Suicidal Thoughts:  No  Homicidal Thoughts:  No  Memory:  Hx of blackouts  Judgement:  Impaired  Insight:  Lacking  Psychomotor Activity:  Restlessness  Concentration:  Concentration: Good and Attention Span: Good  Recall:  Negative  Fund of Knowledge:WDL  Language: Good  Akathisia:  NA  Handed:  Right  AIMS (if indicated):  NA  Assets:  Desire for Improvement Financial Resources/Insurance Housing Resilience Social Support Talents/Skills Transportation Vocational/Educational  ADL's:  Intact  Cognition: Impaired,  Severe alcoholism   Sleep:  No complaint     Assessment : Not ready to stop drinking    and Plan:  Treatment Plan/Recommendations:  Plan of Care: SUD /Core issues (Denial) BH CDIOP See Counselor's individualized treatment plan  Laboratory:  UDS per protocol  Psychotherapy: CDIOP Group Individual family  Medications: see list-refuses Baclofen MAT has rx Naltrexone  Routine PRN Medications:  Negative  Consultations: NA  Safety Concerns:  Continues to drink and deny  Other:  NA

## 2019-10-29 ENCOUNTER — Other Ambulatory Visit: Payer: Self-pay

## 2019-10-29 ENCOUNTER — Other Ambulatory Visit (HOSPITAL_COMMUNITY): Payer: BLUE CROSS/BLUE SHIELD | Admitting: Licensed Clinical Social Worker

## 2019-10-29 DIAGNOSIS — F1994 Other psychoactive substance use, unspecified with psychoactive substance-induced mood disorder: Secondary | ICD-10-CM | POA: Diagnosis not present

## 2019-10-29 DIAGNOSIS — F1020 Alcohol dependence, uncomplicated: Secondary | ICD-10-CM | POA: Diagnosis not present

## 2019-10-29 LAB — BODY FLUID CULTURE
Culture: NO GROWTH
Gram Stain: NONE SEEN

## 2019-10-30 LAB — CULTURE, BLOOD (ROUTINE X 2)
Culture: NO GROWTH
Culture: NO GROWTH
Special Requests: ADEQUATE
Special Requests: ADEQUATE

## 2019-11-02 ENCOUNTER — Encounter (HOSPITAL_COMMUNITY): Payer: BLUE CROSS/BLUE SHIELD

## 2019-11-03 NOTE — Progress Notes (Signed)
    Daily Group Progress Note  Program: CD-IOP   Group Time: 1pm-2:30pm Participation Level: Active Behavioral Response: Appropriate Type of Therapy: Process Group Topic: Clinician checked in with group members, assessing for SI/HI/psychosis and overall level of functioning, including cravings or triggers and coping skills used to deal with uncomfortable feelings. Clinician and group members discussed what went well and could have gone better over the weekend. Group members processed AA Daily Reflection and NA JFT with topics focused on the effect of having the freedom to choose on recovery.  Group Time: 2:30pm-4pm Participation Level: Active Behavioral Response: Appropriate Type of Therapy: Psycho-education Group Topic: Clinician presented the psycho-educational topic of Adult Children of Alcoholics (ACOA). Clinician reviewed with clients the 'Laundry List' and common thought and behavior patterns of families with substance use in the home. Clinician and group members discussed how this affected believes/behaviors in work, intimate and interpersonal relationships. Clinician and group members discussed specifically the importance or lack of importance of knowing intentions of behaviors. Clinician provided clients with '10 Things Adult Children of Addicts Wants You To Know."   Summary: Client checked in with a sobriety date of 3 weeks ago however after challenging client endorsed having a drink before bed to help with the pain following discharge from hospital. Client states it is not a relapse but has not shared this information with his home group. Client was receptive to information about ACOA and is not concerned his behaviors effect his children. Client shows progress by attending meeting after pushing back start date however struggles with insight into triggers for drinking and effect of drinking on family system.   Family Program: Family present? No   Name of family member(s): NA  UDS  collected: No   AA/NA attended?: Yes  Sponsor?: Yes   Harlon Ditty, LCSW

## 2019-11-03 NOTE — Progress Notes (Signed)
    Daily Group Progress Note  Program: CD-IOP   Group Time: 1pm-2:30pm  Participation Level: Active Behavioral Response: Appropriate Type of Therapy: Process Group Topic: Clinician met with clients, assessing for SI/HI/psychosis and overall level of functioning including attendance of recovery meetings and relapse or challenges to sobriety. Clinician and group members discussed highs and lows from previous days and reflection on topic from previous day. Clinician and group members read Daily Reflection and clinician facilitated discussion on topic related to recovery.    Group Time: 2:30pm-4pm Participation Level: Active  Behavioral Response: Appropriate Type of Therapy: Psycho-education Group Topic: Clinician provided psycho-educational group on PAWS. Clinician provided supplemental video with discussion on common PAWS symptoms and skills to help with management. Clinician utilized Post Acute Withdrawal (PAW) Self-Evaluation developed by Patrina Levering. Clinician and group members discussed the importance of being aware and tracking symptoms during recovery. Clinician provided supplemental video of information related to PAWS, common triggers, and relaxation techniques for management of symptoms. Clinician provided exercises for mindfulness as an alternative to meditations.    Summary: Client reported sobriety date of today, 10/29/19 from alcohol with 2 meetings attended since last session. Client reported continued use due to pain and was somewhat resistant when group identified rationalizing continued use and alcohol in the home. Client identified continued trouble identifying triggers for use, though agreed that pain management was a reason rationalization for use.   Family Program: Family present? No   Name of family member(s): NA  UDS collected: No   AA/NA attended?: Yes  Sponsor?: No   Olegario Messier, LCSW

## 2019-11-04 ENCOUNTER — Encounter (HOSPITAL_COMMUNITY): Payer: BLUE CROSS/BLUE SHIELD

## 2019-11-05 ENCOUNTER — Encounter (HOSPITAL_COMMUNITY): Payer: BLUE CROSS/BLUE SHIELD

## 2019-11-09 ENCOUNTER — Encounter (HOSPITAL_COMMUNITY): Payer: BLUE CROSS/BLUE SHIELD

## 2019-11-10 ENCOUNTER — Telehealth (HOSPITAL_COMMUNITY): Payer: Self-pay | Admitting: Licensed Clinical Social Worker

## 2019-11-10 DIAGNOSIS — F1020 Alcohol dependence, uncomplicated: Secondary | ICD-10-CM | POA: Diagnosis not present

## 2019-11-11 ENCOUNTER — Encounter (HOSPITAL_COMMUNITY): Payer: BLUE CROSS/BLUE SHIELD

## 2019-11-11 DIAGNOSIS — F1020 Alcohol dependence, uncomplicated: Secondary | ICD-10-CM | POA: Diagnosis not present

## 2019-11-12 ENCOUNTER — Emergency Department (HOSPITAL_COMMUNITY)
Admission: EM | Admit: 2019-11-12 | Discharge: 2019-11-12 | Disposition: A | Discharge status: Home / Self Care | Payer: BLUE CROSS/BLUE SHIELD | Attending: Emergency Medicine | Admitting: Emergency Medicine

## 2019-11-12 ENCOUNTER — Encounter (HOSPITAL_COMMUNITY): Payer: Self-pay | Admitting: Emergency Medicine

## 2019-11-12 ENCOUNTER — Ambulatory Visit (HOSPITAL_BASED_OUTPATIENT_CLINIC_OR_DEPARTMENT_OTHER): Payer: BLUE CROSS/BLUE SHIELD | Admitting: Medical

## 2019-11-12 ENCOUNTER — Emergency Department (HOSPITAL_COMMUNITY): Payer: BLUE CROSS/BLUE SHIELD

## 2019-11-12 DIAGNOSIS — F17220 Nicotine dependence, chewing tobacco, uncomplicated: Secondary | ICD-10-CM | POA: Diagnosis not present

## 2019-11-12 DIAGNOSIS — F341 Dysthymic disorder: Secondary | ICD-10-CM | POA: Diagnosis not present

## 2019-11-12 DIAGNOSIS — F1020 Alcohol dependence, uncomplicated: Secondary | ICD-10-CM | POA: Diagnosis not present

## 2019-11-12 DIAGNOSIS — J90 Pleural effusion, not elsewhere classified: Secondary | ICD-10-CM | POA: Diagnosis not present

## 2019-11-12 DIAGNOSIS — F1994 Other psychoactive substance use, unspecified with psychoactive substance-induced mood disorder: Secondary | ICD-10-CM

## 2019-11-12 DIAGNOSIS — M25521 Pain in right elbow: Secondary | ICD-10-CM | POA: Diagnosis not present

## 2019-11-12 DIAGNOSIS — Z9111 Patient's noncompliance with dietary regimen: Secondary | ICD-10-CM

## 2019-11-12 DIAGNOSIS — R0789 Other chest pain: Secondary | ICD-10-CM | POA: Diagnosis not present

## 2019-11-12 DIAGNOSIS — Z811 Family history of alcohol abuse and dependence: Secondary | ICD-10-CM

## 2019-11-12 DIAGNOSIS — M7989 Other specified soft tissue disorders: Secondary | ICD-10-CM | POA: Diagnosis not present

## 2019-11-12 DIAGNOSIS — R079 Chest pain, unspecified: Secondary | ICD-10-CM | POA: Insufficient documentation

## 2019-11-12 LAB — CBC WITH DIFFERENTIAL/PLATELET
Abs Immature Granulocytes: 0.02 10*3/uL (ref 0.00–0.07)
Basophils Absolute: 0 10*3/uL (ref 0.0–0.1)
Basophils Relative: 0 %
Eosinophils Absolute: 0 10*3/uL (ref 0.0–0.5)
Eosinophils Relative: 0 %
HCT: 42.9 % (ref 39.0–52.0)
Hemoglobin: 14.8 g/dL (ref 13.0–17.0)
Immature Granulocytes: 0 %
Lymphocytes Relative: 31 %
Lymphs Abs: 1.6 10*3/uL (ref 0.7–4.0)
MCH: 33.2 pg (ref 26.0–34.0)
MCHC: 34.5 g/dL (ref 30.0–36.0)
MCV: 96.2 fL (ref 80.0–100.0)
Monocytes Absolute: 0.3 10*3/uL (ref 0.1–1.0)
Monocytes Relative: 6 %
Neutro Abs: 3.2 10*3/uL (ref 1.7–7.7)
Neutrophils Relative %: 63 %
Platelets: 134 10*3/uL — ABNORMAL LOW (ref 150–400)
RBC: 4.46 MIL/uL (ref 4.22–5.81)
RDW: 15.1 % (ref 11.5–15.5)
WBC: 5.2 10*3/uL (ref 4.0–10.5)
nRBC: 0 % (ref 0.0–0.2)

## 2019-11-12 LAB — COMPREHENSIVE METABOLIC PANEL
ALT: 30 U/L (ref 0–44)
AST: 36 U/L (ref 15–41)
Albumin: 4.7 g/dL (ref 3.5–5.0)
Alkaline Phosphatase: 58 U/L (ref 38–126)
Anion gap: 12 (ref 5–15)
BUN: 8 mg/dL (ref 6–20)
CO2: 28 mmol/L (ref 22–32)
Calcium: 9.4 mg/dL (ref 8.9–10.3)
Chloride: 104 mmol/L (ref 98–111)
Creatinine, Ser: 0.69 mg/dL (ref 0.61–1.24)
GFR, Estimated: >60 mL/min (ref >60)
Glucose, Bld: 100 mg/dL — ABNORMAL HIGH (ref 70–99)
Potassium: 4.3 mmol/L (ref 3.5–5.1)
Sodium: 144 mmol/L (ref 135–145)
Total Bilirubin: 0.7 mg/dL (ref 0.3–1.2)
Total Protein: 7.5 g/dL (ref 6.5–8.1)

## 2019-11-12 LAB — TROPONIN I (HIGH SENSITIVITY)
Troponin I (High Sensitivity): 4 ng/L (ref <18)
Troponin I (High Sensitivity): 5 ng/L (ref <18)

## 2019-11-12 LAB — LACTIC ACID, PLASMA: Lactic Acid, Venous: 1.5 mmol/L (ref 0.5–1.9)

## 2019-11-12 MED ORDER — SODIUM CHLORIDE 0.9 % IV BOLUS
1000.0000 mL | Freq: Once | INTRAVENOUS | Status: AC
  Administered 2019-11-12: 1000 mL via INTRAVENOUS

## 2019-11-12 MED ORDER — LORAZEPAM 2 MG/ML IJ SOLN
1.0000 mg | Freq: Once | INTRAMUSCULAR | Status: AC
  Administered 2019-11-12: 1 mg via INTRAVENOUS
  Filled 2019-11-12: qty 1

## 2019-11-12 MED ORDER — LORAZEPAM 1 MG PO TABS: 0.5000 mg | ORAL_TABLET | Freq: Once | ORAL | Status: DC

## 2019-11-12 MED ORDER — CLINDAMYCIN HCL 300 MG PO CAPS: 300.0000 mg | ORAL_CAPSULE | Freq: Three times a day (TID) | ORAL | 0 refills | Status: AC

## 2019-11-12 NOTE — Social Work (Signed)
CSW met with Pt at bedside. Pt reports past period of abstinence, however due to recent hospitalization had a return to use. Pt is planning to attend private rehab clinic and is already connected to multiple resources. Pt requested SMART Recovery resources, CSW printed resources and provided these to Pt.

## 2019-11-12 NOTE — Discharge Instructions (Addendum)
 ?  erry L Jacobsen:  Thank you for allowing [place] to take care of you today.  We hope you begin feeling better soon.  To-Do: 1. Please follow-up with your primary doctor or call  to schedule an appointment with a new primary care doctor 2. Please call Ortho to schedule follow up 3. Please take Clindamycin as instructed for suspected elbow bursitis  4. Please return to the Emergency Department or call 911 if you experience chest pain, shortness of breath, severe pain, severe fever, altered mental status, or have any reason to think that you need emergency medical care.   Thank you again.  Hope you feel better soon.

## 2019-11-12 NOTE — ED Triage Notes (Signed)
Pt recently admitted for right elbow cellulitis. Reports swelling started about 4 hours ago. Last drink 6 hours ago and would like to detox

## 2019-11-12 NOTE — ED Notes (Signed)
Patient concerned about ETOH withdrawal symptoms, states his last drink was a bottle of wine at around 1000 this morning, normally drinks 2-3 bottles of wine per day. CIWA 6.

## 2019-11-12 NOTE — ED Notes (Signed)
Pt spoke w social work, states he's going to private rehab for alcohol abuse, Rx sent to pharmacy, advised to follow up w PCP and orthopedics

## 2019-11-12 NOTE — ED Provider Notes (Signed)
  Physical Exam  BP 138/87 (BP Location: Left Arm)   Pulse 99   Temp 98.1 F (36.7 C) (Oral)   Resp 16   Ht 6\' 3"  (1.905 m)   Wt 90.7 kg   SpO2 96%   BMI 25.00 kg/m   Physical Exam  ED Course/Procedures     Procedures  MDM  Care of patient assumed at 1500 from Dr. .  Patient had a recent bout of cellulitis and came in with worsening elbow pain.  Appeared to have an inflammatory bursitis, but due to recent infection and recent completion of antibiotics, they were planning to discharge with clindamycin for concern for possible infectious bursitis.  However, upon discharge, patient started complaining of anxiety and chest pain.  EKG reassuring, but now pending chest x-ray and repeat troponins.  Chest x-ray unremarkable as interpreted by myself and radiology.  Both troponins were normal.  Very low suspicion for ongoing ACS.  Patient had reported symptom improvement after Ativan administration and was stable on reevaluation.  Advised patient of concern for inflammatory bursitis. Advised treatment of symptoms with Tylenol Motrin for pain as needed, clindamycin had already been sent to the pharmacy by Dr. Nunzio Cory. Recommended follow-up with PCP in the next couple days. Strict return precautions provided. Patient discharged in stable condition.   The care of this patient was overseen by Dr. Nunzio Cory, ED attending, who agreed with evaluation and management.      Fredderick Phenix, MD 11/12/19 13/04/21    Jerene Bears, MD 11/12/19 2258

## 2019-11-12 NOTE — ED Provider Notes (Addendum)
[facility] EMERGENCY DEPARTMENT Provider Note   CSN: [id number] Arrival date & time: 11/12/19  1340     History Chief Complaint  Patient presents with  . Elbow Pain    Robert Norton is a 49 y.o. male.  The history is provided by the patient.  Arm Injury Location:  Elbow Elbow location:  R elbow Injury: no   Pain details:    Severity:  Mild   Onset quality:  Sudden   Duration:  2 hours   Timing:  Constant   Progression:  Unchanged Dislocation: no   Foreign body present:  No foreign bodies Associated symptoms: no back pain, no fever and no neck pain   Risk factors comment:  Recent cellulitis, completed tx   HPI: A 49 year old patient presents for evaluation of chest pain. Initial onset of pain was less than one hour ago. The patient's chest pain is sharp and is not worse with exertion. The patient's chest pain is middle- or left-sided, is not well-localized, is not described as heaviness/pressure/tightness and does radiate to the arms/jaw/neck. The patient does not complain of nausea and denies diaphoresis. The patient has no history of stroke, has no history of peripheral artery disease, has not smoked in the past 90 days, denies any history of treated diabetes, has no relevant family history of coronary artery disease (first degree relative at less than age 39), is not hypertensive, has no history of hypercholesterolemia and does not have an elevated BMI (>=30).   Past Medical History:  Diagnosis Date  . Abdominal cramps   . Acute meniscal tear of left knee   . Chest pain   . Cough   . Diaphoresis   . Diarrhea   . Elbow pain   . Elevated liver enzymes   . Epicondylitis, lateral   . ETOH abuse   . GERD without esophagitis   . Left knee pain   . Mallet fracture, closed   . Pain in left shin   . Shoulder pain   . Sinusitis   . URI (upper respiratory infection)     Patient Active Problem List   Diagnosis Date Noted  . Nonpurulent cellulitis of  right arm 10/25/2019  . Alcohol use disorder, moderate, in early remission, dependence (HCC) 10/25/2019    Past Surgical History:  Procedure Laterality Date  . APPENDECTOMY    . KNEE SURGERY    . PILONIDAL CYST DRAINAGE         Family History  Problem Relation Age of Onset  . Diabetes Mother     Social History   Tobacco Use  . Smoking status: Never Smoker  . Smokeless tobacco: Current User    Types: Chew  . Tobacco comment: 1.5 tins per day for 20 years  Vaping Use  . Vaping Use: Never used  Substance Use Topics  . Alcohol use: Yes    Comment: former alcoholic; drinks occ  . Drug use: No    Home Medications Prior to Admission medications   Medication Sig Start Date End Date Taking? Authorizing Provider  clindamycin (CLEOCIN) 300 MG capsule Take 1 capsule (300 mg total) by mouth 3 (three) times daily for 7 days. 11/12/19 11/19/19  Brantley Fling, MD  cloNIDine (CATAPRES) 0.1 MG tablet Take 0.1 mg by mouth 3 (three) times daily.  10/12/19   [provider]  escitalopram (LEXAPRO) 5 MG tablet Take 5 mg by mouth daily. 07/04/19   [provider]  gabapentin (NEURONTIN) 300 MG capsule Take  300 mg by mouth in the morning and at bedtime. 10/12/19   [provider]  naltrexone (DEPADE) 50 MG tablet Take 50 mg by mouth daily. 10/13/19   [provider]    Allergies    Penicillins  Review of Systems   Review of Systems  Constitutional: Negative for fever.  HENT: Negative for congestion and sore throat.   Respiratory: Negative for cough and shortness of breath.   Cardiovascular: Negative for chest pain.  Gastrointestinal: Negative for abdominal pain, diarrhea and nausea.  Genitourinary: Negative for difficulty urinating.  Musculoskeletal: Negative for back pain and neck pain.       R elbow pain  Skin: Negative for color change and rash.  Neurological: Negative for weakness and numbness.  All other systems reviewed and are  negative.   Physical Exam Updated Vital Signs BP 126/87   Pulse 97   Temp 98.1 F (36.7 C) (Oral)   Resp 16   Ht 6\' 3"  (1.905 m)   Wt 90.7 kg   SpO2 96%   BMI 25.00 kg/m   Physical Exam Vitals reviewed.  Constitutional:      Appearance: Normal appearance.  HENT:     Head: Normocephalic and atraumatic.     Nose: Nose normal.     Mouth/Throat:     Mouth: Mucous membranes are moist.     Pharynx: Oropharynx is clear.  Eyes:     Conjunctiva/sclera: Conjunctivae normal.  Cardiovascular:     Heart sounds: Normal heart sounds.  Pulmonary:     Effort: Pulmonary effort is normal.     Breath sounds: Normal breath sounds.  Abdominal:     General: Abdomen is flat.     Palpations: Abdomen is soft.     Tenderness: There is no abdominal tenderness.  Musculoskeletal:        General: Swelling and tenderness present.     Right lower leg: No edema.     Left lower leg: No edema.     Comments: R elbow at olecranon is swollen, edematous, no erythema, fluctuance  Skin:    General: Skin is warm and dry.  Neurological:     Mental Status: He is alert.  Psychiatric:        Mood and Affect: Mood normal.        Behavior: Behavior normal.     ED Results / Procedures / Treatments   Labs (all labs ordered are listed, but only abnormal results are displayed) Labs Reviewed  COMPREHENSIVE METABOLIC PANEL - Abnormal; Notable for the following components:      Result Value   Glucose, Bld 100 (*)    All other components within normal limits  CBC WITH DIFFERENTIAL/PLATELET - Abnormal; Notable for the following components:   Platelets 134 (*)    All other components within normal limits  LACTIC ACID, PLASMA  TROPONIN I (HIGH SENSITIVITY)    EKG None  Radiology DG Elbow 2 Views Right  Result Date: 11/12/2019 CLINICAL DATA:  Elbow pain.  Swelling. EXAM: RIGHT ELBOW - 2 VIEW COMPARISON:  Radiographs 10/25/2019. FINDINGS: There is no evidence of acute fracture, dislocation, or joint  effusion. Small corticated osseous fragment posterior to the olecranon. No bony erosive change. There is no evidence of arthropathy or other focal bone abnormality. Improved, but persistent swelling predominately involving the posterior elbow. IMPRESSION: 1. Improved, but persistent swelling predominately involving the posterior elbow. 2. No evidence of acute fracture or dislocation.  No joint effusion. 3. Small corticated osseous fragment posterior  to the olecranon, possibly degenerative or secondary to prior trauma. Electronically Signed   By: Feliberto Harts MD   On: 11/12/2019 14:58    Procedures Procedures (including critical care time)  Medications Ordered in ED Medications  sodium chloride 0.9 % bolus 1,000 mL (has no administration in time range)  LORazepam (ATIVAN) injection 1 mg (has no administration in time range)    ED Course  I have reviewed the triage vital signs and the nursing notes.  Pertinent labs & imaging results that were available during my care of the patient were reviewed by me and considered in my medical decision making (see chart for details).    MDM Rules/Calculators/A&P HEAR Score: 1                        Medical Decision Making: DETAVIOUS RINN is a 49 y.o. male who presented to the ED today with R elbow swelling. Pt reports recent cellulitis, completed abx treatment, today more swollen and painful, no reported injury prior.  Pt reports today having intermittent HA and chills. Pt has hx alcohol abuse, last drink 6 hr ago.   Past medical history significant for alcohol abuse, GERD, hx of recent hospital admission for R elbow cellulitis with olcecranon bursitis, dc 10/27/19 with prescription for 6 days of cefdinir 300 mg twice daily, completed abx (11/02/19).  Reviewed and confirmed nursing documentation for past medical history, family history, social history.  On my initial exam, the pt was resting comfortably, in NAD, normal ROM in R elbow joint,  superficial soft tissue swelling over olecranon, no erythema, warmth, or fluctuance.   CBC and CMP unremarkable. Xray elbow shows improved, but persistent swelling predominately involving the posterior elbow.  No evidence of acute fracture or dislocation.  No joint effusion.  Consults: none performed  All radiology and laboratory studies reviewed independently and with my attending physician, agree with reading provided by radiologist unless otherwise noted.    Based on the above findings, I believe patient is hemodynamically stable for discharge  Will dc with clindamycin and plan for ortho follow up outpt.     Notififed by nursing that at time of dc, that pt reports he is having a heart attack. On my evaluation, pt having L muscle contraction and reporting L chest tightness. Pt is not diaphoretic, no SOB, not tachycardic. Progressive report of muscle spasm during exam, pt now contracting both arms tightly. Concern for panic attack, given 0.5 mg ativan.  Atypical CP, low risk pt without hx of CAD, HTN, HLD.  EKG obtained, unremarkable, normal sinus rhythm, no acute ischemic changes.  HEAR score 1. Will obtain troponin, CXR and give fluids.   PLAN:  Follow up troponin Follow up CXR Reassess pt, likely dc home   Pt care handed off to Dr. Madaline Guthrie at 3:49 pm. Pt history and plan has been discussed. Please see her note for pt plan and ultimate disposition.    The above care was discussed with and agreed upon by my attending physician.  Emergency Department Medication Summary:  Medications  sodium chloride 0.9 % bolus 1,000 mL (has no administration in time range)  LORazepam (ATIVAN) injection 1 mg (has no administration in time range)       Final Clinical Impression(s) / ED Diagnoses Final diagnoses:  Right elbow pain    Rx / DC Orders ED Discharge Orders         Ordered    clindamycin (CLEOCIN) 300 MG capsule  3 times daily        11/12/19 1516           Brantley FlingLyons,  Bahja Bence, MD 11/12/19 1549    Brantley FlingLyons, Jorgia Manthei, MD 11/12/19 1557    Virgina Norfolkuratolo, Adam, DO 11/12/19 1612

## 2019-11-16 ENCOUNTER — Encounter (HOSPITAL_COMMUNITY): Payer: BLUE CROSS/BLUE SHIELD

## 2019-11-18 ENCOUNTER — Encounter (HOSPITAL_COMMUNITY): Payer: BLUE CROSS/BLUE SHIELD

## 2019-11-19 ENCOUNTER — Encounter (HOSPITAL_COMMUNITY): Payer: BLUE CROSS/BLUE SHIELD

## 2019-11-23 ENCOUNTER — Encounter (HOSPITAL_COMMUNITY): Payer: BLUE CROSS/BLUE SHIELD

## 2019-11-24 DIAGNOSIS — F1020 Alcohol dependence, uncomplicated: Secondary | ICD-10-CM | POA: Diagnosis not present

## 2019-11-24 DIAGNOSIS — F419 Anxiety disorder, unspecified: Secondary | ICD-10-CM | POA: Diagnosis not present

## 2019-11-25 ENCOUNTER — Encounter (HOSPITAL_COMMUNITY): Payer: BLUE CROSS/BLUE SHIELD

## 2019-11-26 ENCOUNTER — Encounter (HOSPITAL_COMMUNITY): Payer: BLUE CROSS/BLUE SHIELD

## 2019-11-26 ENCOUNTER — Encounter (HOSPITAL_COMMUNITY): Payer: Self-pay | Admitting: Licensed Clinical Social Worker

## 2019-11-26 ENCOUNTER — Encounter (HOSPITAL_COMMUNITY): Payer: Self-pay

## 2019-11-26 NOTE — Progress Notes (Signed)
[facility]                                                      Discharge Summary                                                                                                                                                                     Date of Admission: 10/20.2021 [place] Provider:Girlfriend Date of Discharge: 11/12/2019 Sobriety Date:Never acheived  Admission Diagnosis:   ICD-10-CM   1. Alcohol use disorder, severe, dependence ([place])  F10.20   2. Family history of alcoholism in paternal grandfather  Z81.1   21. Family history of alcoholism in grandfather  Z81.1   47. 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 Substance induced mood disorder ([place])  F19.94     Course of Treatment: Pt attended 2 sessions and failed to return .Failed to respond to attempts to contact  Daily Group Progress Note  Program: CD-IOP Summary: Client reported sobriety date of today, 10/29/19 from alcohol with 2 meetings attended since last session. Client reported continued use due to pain and was somewhat resistant when group identified rationalizing continued use and alcohol in the home. Client identified continued trouble identifying triggers for use, though agreed that pain management was a reason rationalization for use.   Medications: clindamycin 300 MG capsule Commonly known as: Cleocin Take 1 capsule (300 mg total) by mouth 3 (three) times daily for 7 days.  cloNIDine 0.1 MG tablet Commonly known as: CATAPRES Take 0.1 mg by mouth 3 (three) times daily.  Disulfiram 500 MG Tabs Take 1 tablet by mouth daily.  escitalopram 5 MG tablet Commonly known as: LEXAPRO Take 5 mg by mouth daily.  gabapentin 300 MG capsule Commonly known as: NEURONTIN Take 300 mg by mouth in the morning and  at bedtime.  hydrOXYzine  25 MG tablet Commonly known as: ATARAX/VISTARIL Take 25 mg by mouth 4 (four) times daily.  naltrexone 50 MG tablet Commonly known as: DEPADE Take 50 mg by mouth daily.    Discharge Diagnosis: 0 Alcohol use disorder, severe, dependence ([place]) 0 Family history of alcoholism in paternal grandfather 0 Family history of alcoholism in grandfather 0 Dysthymic disorder 0 Substance induced mood disorder ([place]) 0 Uncomplicated alcohol dependence ([place]) 0 Noncompliance with treatment plan   Plan of Action to Address Continuing Problems:  Pt spoke w social work, states he's going to private rehab for alcohol abuse, Rx sent to pharmacy, advised to follow up w PCP and orthopedics        Electronically signed by Gypsy Balsam, RN at 11/12/2019 7:30 PM CSW met with Pt at bedside. Pt reports past period of abstinence, however due to recent hospitalization had a return to use. Pt is planning to attend private rehab clinic and is already connected to multiple resources. Pt requested [facility] resources, CSW printed resources and provided these to Pt.        Electronically signed by Vergie Living, LCSW at 11/12/2019 5:09 PM  Client has NOTparticipated in the development of this discharge plan and can receive a copy of this completed plan

## 2019-12-02 DIAGNOSIS — G479 Sleep disorder, unspecified: Secondary | ICD-10-CM | POA: Diagnosis not present

## 2019-12-02 DIAGNOSIS — F1020 Alcohol dependence, uncomplicated: Secondary | ICD-10-CM | POA: Diagnosis not present

## 2019-12-15 DIAGNOSIS — F1020 Alcohol dependence, uncomplicated: Secondary | ICD-10-CM | POA: Diagnosis not present

## 2019-12-16 DIAGNOSIS — G479 Sleep disorder, unspecified: Secondary | ICD-10-CM | POA: Diagnosis not present

## 2019-12-16 DIAGNOSIS — F419 Anxiety disorder, unspecified: Secondary | ICD-10-CM | POA: Diagnosis not present

## 2019-12-16 DIAGNOSIS — F1020 Alcohol dependence, uncomplicated: Secondary | ICD-10-CM | POA: Diagnosis not present

## 2019-12-23 DIAGNOSIS — F1020 Alcohol dependence, uncomplicated: Secondary | ICD-10-CM | POA: Diagnosis not present

## 2019-12-30 DIAGNOSIS — F419 Anxiety disorder, unspecified: Secondary | ICD-10-CM | POA: Diagnosis not present

## 2019-12-30 DIAGNOSIS — F1020 Alcohol dependence, uncomplicated: Secondary | ICD-10-CM | POA: Diagnosis not present

## 2019-12-30 DIAGNOSIS — G479 Sleep disorder, unspecified: Secondary | ICD-10-CM | POA: Diagnosis not present

## 2020-01-06 DIAGNOSIS — F1020 Alcohol dependence, uncomplicated: Secondary | ICD-10-CM | POA: Diagnosis not present

## 2020-01-13 DIAGNOSIS — F419 Anxiety disorder, unspecified: Secondary | ICD-10-CM | POA: Diagnosis not present

## 2020-01-13 DIAGNOSIS — F1020 Alcohol dependence, uncomplicated: Secondary | ICD-10-CM | POA: Diagnosis not present

## 2020-01-20 DIAGNOSIS — F1020 Alcohol dependence, uncomplicated: Secondary | ICD-10-CM | POA: Diagnosis not present

## 2020-01-21 DIAGNOSIS — F419 Anxiety disorder, unspecified: Secondary | ICD-10-CM | POA: Diagnosis not present

## 2020-01-21 DIAGNOSIS — F1020 Alcohol dependence, uncomplicated: Secondary | ICD-10-CM | POA: Diagnosis not present

## 2020-01-27 DIAGNOSIS — F1020 Alcohol dependence, uncomplicated: Secondary | ICD-10-CM | POA: Diagnosis not present

## 2020-01-28 DIAGNOSIS — F1020 Alcohol dependence, uncomplicated: Secondary | ICD-10-CM | POA: Diagnosis not present

## 2020-01-28 DIAGNOSIS — F419 Anxiety disorder, unspecified: Secondary | ICD-10-CM | POA: Diagnosis not present

## 2020-02-03 DIAGNOSIS — F1020 Alcohol dependence, uncomplicated: Secondary | ICD-10-CM | POA: Diagnosis not present

## 2020-02-04 DIAGNOSIS — F1020 Alcohol dependence, uncomplicated: Secondary | ICD-10-CM | POA: Diagnosis not present

## 2020-02-04 DIAGNOSIS — F419 Anxiety disorder, unspecified: Secondary | ICD-10-CM | POA: Diagnosis not present

## 2020-02-11 DIAGNOSIS — F419 Anxiety disorder, unspecified: Secondary | ICD-10-CM | POA: Diagnosis not present

## 2020-02-11 DIAGNOSIS — F1020 Alcohol dependence, uncomplicated: Secondary | ICD-10-CM | POA: Diagnosis not present

## 2020-02-17 DIAGNOSIS — F1020 Alcohol dependence, uncomplicated: Secondary | ICD-10-CM | POA: Diagnosis not present

## 2020-02-18 DIAGNOSIS — F1020 Alcohol dependence, uncomplicated: Secondary | ICD-10-CM | POA: Diagnosis not present

## 2020-02-18 DIAGNOSIS — F419 Anxiety disorder, unspecified: Secondary | ICD-10-CM | POA: Diagnosis not present

## 2020-03-03 DIAGNOSIS — F1020 Alcohol dependence, uncomplicated: Secondary | ICD-10-CM | POA: Diagnosis not present

## 2020-03-04 DIAGNOSIS — F1020 Alcohol dependence, uncomplicated: Secondary | ICD-10-CM | POA: Diagnosis not present

## 2020-03-31 DIAGNOSIS — F419 Anxiety disorder, unspecified: Secondary | ICD-10-CM | POA: Diagnosis not present

## 2020-03-31 DIAGNOSIS — F1020 Alcohol dependence, uncomplicated: Secondary | ICD-10-CM | POA: Diagnosis not present

## 2020-04-01 DIAGNOSIS — F1020 Alcohol dependence, uncomplicated: Secondary | ICD-10-CM | POA: Diagnosis not present

## 2020-04-01 DIAGNOSIS — G4700 Insomnia, unspecified: Secondary | ICD-10-CM | POA: Diagnosis not present

## 2020-05-03 DIAGNOSIS — F1020 Alcohol dependence, uncomplicated: Secondary | ICD-10-CM | POA: Diagnosis not present

## 2020-05-10 DIAGNOSIS — F10920 Alcohol use, unspecified with intoxication, uncomplicated: Secondary | ICD-10-CM | POA: Diagnosis not present

## 2020-05-10 DIAGNOSIS — Z20822 Contact with and (suspected) exposure to covid-19: Secondary | ICD-10-CM | POA: Diagnosis not present

## 2020-05-13 DIAGNOSIS — F1020 Alcohol dependence, uncomplicated: Secondary | ICD-10-CM | POA: Diagnosis not present

## 2020-05-13 DIAGNOSIS — F419 Anxiety disorder, unspecified: Secondary | ICD-10-CM | POA: Diagnosis not present

## 2020-05-17 DIAGNOSIS — F1020 Alcohol dependence, uncomplicated: Secondary | ICD-10-CM | POA: Diagnosis not present

## 2020-05-17 DIAGNOSIS — F419 Anxiety disorder, unspecified: Secondary | ICD-10-CM | POA: Diagnosis not present

## 2020-05-19 DIAGNOSIS — F1020 Alcohol dependence, uncomplicated: Secondary | ICD-10-CM | POA: Diagnosis not present

## 2020-05-19 DIAGNOSIS — F419 Anxiety disorder, unspecified: Secondary | ICD-10-CM | POA: Diagnosis not present

## 2020-05-20 DIAGNOSIS — F1020 Alcohol dependence, uncomplicated: Secondary | ICD-10-CM | POA: Diagnosis not present

## 2020-05-20 DIAGNOSIS — F419 Anxiety disorder, unspecified: Secondary | ICD-10-CM | POA: Diagnosis not present

## 2020-05-27 DIAGNOSIS — F1020 Alcohol dependence, uncomplicated: Secondary | ICD-10-CM | POA: Diagnosis not present

## 2020-05-27 DIAGNOSIS — F419 Anxiety disorder, unspecified: Secondary | ICD-10-CM | POA: Diagnosis not present

## 2020-06-04 DIAGNOSIS — F1120 Opioid dependence, uncomplicated: Secondary | ICD-10-CM | POA: Diagnosis not present

## 2020-06-04 DIAGNOSIS — Z113 Encounter for screening for infections with a predominantly sexual mode of transmission: Secondary | ICD-10-CM | POA: Diagnosis not present

## 2020-06-04 DIAGNOSIS — F1020 Alcohol dependence, uncomplicated: Secondary | ICD-10-CM | POA: Diagnosis not present

## 2020-06-16 DIAGNOSIS — F1020 Alcohol dependence, uncomplicated: Secondary | ICD-10-CM | POA: Diagnosis not present

## 2020-06-16 DIAGNOSIS — F419 Anxiety disorder, unspecified: Secondary | ICD-10-CM | POA: Diagnosis not present

## 2020-06-17 DIAGNOSIS — F419 Anxiety disorder, unspecified: Secondary | ICD-10-CM | POA: Diagnosis not present

## 2020-06-17 DIAGNOSIS — F1020 Alcohol dependence, uncomplicated: Secondary | ICD-10-CM | POA: Diagnosis not present

## 2020-06-24 DIAGNOSIS — F1020 Alcohol dependence, uncomplicated: Secondary | ICD-10-CM | POA: Diagnosis not present

## 2020-06-24 DIAGNOSIS — G479 Sleep disorder, unspecified: Secondary | ICD-10-CM | POA: Diagnosis not present

## 2020-06-24 DIAGNOSIS — F419 Anxiety disorder, unspecified: Secondary | ICD-10-CM | POA: Diagnosis not present

## 2020-06-29 DIAGNOSIS — F1020 Alcohol dependence, uncomplicated: Secondary | ICD-10-CM | POA: Diagnosis not present

## 2020-06-29 DIAGNOSIS — F419 Anxiety disorder, unspecified: Secondary | ICD-10-CM | POA: Diagnosis not present

## 2020-06-30 DIAGNOSIS — F1020 Alcohol dependence, uncomplicated: Secondary | ICD-10-CM | POA: Diagnosis not present

## 2020-06-30 DIAGNOSIS — F419 Anxiety disorder, unspecified: Secondary | ICD-10-CM | POA: Diagnosis not present

## 2020-07-05 DIAGNOSIS — F1020 Alcohol dependence, uncomplicated: Secondary | ICD-10-CM | POA: Diagnosis not present

## 2020-07-05 DIAGNOSIS — F419 Anxiety disorder, unspecified: Secondary | ICD-10-CM | POA: Diagnosis not present

## 2020-07-12 DIAGNOSIS — G479 Sleep disorder, unspecified: Secondary | ICD-10-CM | POA: Diagnosis not present

## 2020-07-12 DIAGNOSIS — F419 Anxiety disorder, unspecified: Secondary | ICD-10-CM | POA: Diagnosis not present

## 2020-07-12 DIAGNOSIS — F1020 Alcohol dependence, uncomplicated: Secondary | ICD-10-CM | POA: Diagnosis not present

## 2020-07-15 DIAGNOSIS — F419 Anxiety disorder, unspecified: Secondary | ICD-10-CM | POA: Diagnosis not present

## 2020-07-15 DIAGNOSIS — F1020 Alcohol dependence, uncomplicated: Secondary | ICD-10-CM | POA: Diagnosis not present

## 2020-07-20 DIAGNOSIS — F419 Anxiety disorder, unspecified: Secondary | ICD-10-CM | POA: Diagnosis not present

## 2020-07-20 DIAGNOSIS — F1020 Alcohol dependence, uncomplicated: Secondary | ICD-10-CM | POA: Diagnosis not present

## 2020-07-28 ENCOUNTER — Other Ambulatory Visit: Payer: Self-pay

## 2020-07-28 ENCOUNTER — Emergency Department (HOSPITAL_COMMUNITY)
Admission: EM | Admit: 2020-07-28 | Discharge: 2020-07-28 | Discharge status: Against Medical Advice | Payer: BC Managed Care – PPO | Attending: Student | Admitting: Student

## 2020-07-28 ENCOUNTER — Encounter (HOSPITAL_COMMUNITY): Payer: Self-pay

## 2020-07-28 DIAGNOSIS — F1099 Alcohol use, unspecified with unspecified alcohol-induced disorder: Secondary | ICD-10-CM | POA: Diagnosis not present

## 2020-07-28 DIAGNOSIS — Z5321 Procedure and treatment not carried out due to patient leaving prior to being seen by health care provider: Secondary | ICD-10-CM | POA: Insufficient documentation

## 2020-07-28 DIAGNOSIS — R45851 Suicidal ideations: Secondary | ICD-10-CM | POA: Diagnosis not present

## 2020-07-28 DIAGNOSIS — Y908 Blood alcohol level of 240 mg/100 ml or more: Secondary | ICD-10-CM | POA: Insufficient documentation

## 2020-07-28 LAB — COMPREHENSIVE METABOLIC PANEL
ALT: 40 U/L (ref 0–44)
AST: 58 U/L — ABNORMAL HIGH (ref 15–41)
Albumin: 5 g/dL (ref 3.5–5.0)
Alkaline Phosphatase: 66 U/L (ref 38–126)
Anion gap: 11 (ref 5–15)
BUN: 11 mg/dL (ref 6–20)
CO2: 23 mmol/L (ref 22–32)
Calcium: 9 mg/dL (ref 8.9–10.3)
Chloride: 102 mmol/L (ref 98–111)
Creatinine, Ser: 0.86 mg/dL (ref 0.61–1.24)
GFR, Estimated: >60 mL/min (ref >60)
Glucose, Bld: 97 mg/dL (ref 70–99)
Potassium: 3.9 mmol/L (ref 3.5–5.1)
Sodium: 136 mmol/L (ref 135–145)
Total Bilirubin: 1.2 mg/dL (ref 0.3–1.2)
Total Protein: 8 g/dL (ref 6.5–8.1)

## 2020-07-28 LAB — CBC WITH DIFFERENTIAL/PLATELET
Abs Immature Granulocytes: 0.04 10*3/uL (ref 0.00–0.07)
Basophils Absolute: 0 10*3/uL (ref 0.0–0.1)
Basophils Relative: 1 %
Eosinophils Absolute: 0 10*3/uL (ref 0.0–0.5)
Eosinophils Relative: 0 %
HCT: 43.8 % (ref 39.0–52.0)
Hemoglobin: 15.9 g/dL (ref 13.0–17.0)
Immature Granulocytes: 1 %
Lymphocytes Relative: 26 %
Lymphs Abs: 2.2 10*3/uL (ref 0.7–4.0)
MCH: 33.1 pg (ref 26.0–34.0)
MCHC: 36.3 g/dL — ABNORMAL HIGH (ref 30.0–36.0)
MCV: 91.3 fL (ref 80.0–100.0)
Monocytes Absolute: 0.5 10*3/uL (ref 0.1–1.0)
Monocytes Relative: 6 %
Neutro Abs: 5.6 10*3/uL (ref 1.7–7.7)
Neutrophils Relative %: 66 %
Platelets: 129 10*3/uL — ABNORMAL LOW (ref 150–400)
RBC: 4.8 MIL/uL (ref 4.22–5.81)
RDW: 12.9 % (ref 11.5–15.5)
WBC: 8.5 10*3/uL (ref 4.0–10.5)
nRBC: 0 % (ref 0.0–0.2)

## 2020-07-28 LAB — ACETAMINOPHEN LEVEL: Acetaminophen (Tylenol), Serum: <10 ug/mL — ABNORMAL LOW (ref 10–30)

## 2020-07-28 LAB — SALICYLATE LEVEL: Salicylate Lvl: <7 mg/dL — ABNORMAL LOW (ref 7.0–30.0)

## 2020-07-28 LAB — ETHANOL: Alcohol, Ethyl (B): 413 mg/dL (ref <10)

## 2020-07-28 MED ORDER — LORAZEPAM 1 MG PO TABS
0.5000 mg | ORAL_TABLET | Freq: Once | ORAL | Status: AC
  Administered 2020-07-28: 0.5 mg via ORAL
  Filled 2020-07-28: qty 1

## 2020-07-28 NOTE — ED Notes (Signed)
Attempted to call patient and attempted to call patient's significant other.  No answer from either person.

## 2020-07-28 NOTE — ED Notes (Signed)
Patient has not returned to ED.

## 2020-07-28 NOTE — ED Triage Notes (Signed)
Pt states that he has been drinking a lot and he wants to kill himself.

## 2020-07-28 NOTE — ED Provider Notes (Signed)
Emergency Medicine Provider Triage Evaluation Note  Robert Norton , a 50 y.o. male  was evaluated in triage.  Pt complains of suicidal ideations starting today, has no plan, and has never attempted this in the past.  He denies hallucinations or delusions, endorse that he is been drinking alcohol.  He states he was sober for 107 days but unfortunately the last 4 days started to drink again states he drank 2 bottles of wine today.  He has never had alcohol induced seizures, delirium or been hospitalized due to withdrawal symptoms.  He has a bruise on his left eye does not remember how he got it, denies headaches, change in vision, paresthesia or weakness in upper and lower extremities denies neck or back pain..  Review of Systems  Positive: Suicidal ideations, ecchymosis left eye  Negative: Homicidal or hallucinations/delusions  Physical Exam  BP (!) 169/93 (BP Location: Right Arm)   Pulse (!) 135   Temp 98.3 F (36.8 C) (Oral)   Resp 20   Ht 6\' 1"  (1.854 m)   Wt 91.6 kg   SpO2 100%   BMI 26.65 kg/m  Gen:   Awake, no distress   Resp:  Normal effort  MSK:   Moves extremities without difficulty  Other:    Medical Decision Making  Medically screening exam initiated at 9:39 PM.  Appropriate orders placed.  Robert Norton was informed that the remainder of the evaluation will be completed by another provider, this initial triage assessment does not replace that evaluation, and the importance of remaining in the ED until their evaluation is complete.  Presents with suicidal ideations, patient will need further work-up.   Roylene Reason, PA-C 07/28/20 2141    2142, MD 08/01/20 1101

## 2020-07-28 NOTE — ED Notes (Signed)
Patient no longer in triage at this time.  Patient left triage when this RN was in triaging another patient.  Notified security and off duty [employer].

## 2020-07-30 DIAGNOSIS — F1020 Alcohol dependence, uncomplicated: Secondary | ICD-10-CM | POA: Diagnosis not present

## 2020-07-30 DIAGNOSIS — Z5181 Encounter for therapeutic drug level monitoring: Secondary | ICD-10-CM | POA: Diagnosis not present

## 2020-07-31 DIAGNOSIS — F1020 Alcohol dependence, uncomplicated: Secondary | ICD-10-CM | POA: Diagnosis not present

## 2020-08-01 DIAGNOSIS — F1020 Alcohol dependence, uncomplicated: Secondary | ICD-10-CM | POA: Diagnosis not present

## 2020-08-02 DIAGNOSIS — F1020 Alcohol dependence, uncomplicated: Secondary | ICD-10-CM | POA: Diagnosis not present

## 2020-08-03 DIAGNOSIS — F1020 Alcohol dependence, uncomplicated: Secondary | ICD-10-CM | POA: Diagnosis not present

## 2020-08-04 DIAGNOSIS — F1020 Alcohol dependence, uncomplicated: Secondary | ICD-10-CM | POA: Diagnosis not present

## 2020-08-05 DIAGNOSIS — F1020 Alcohol dependence, uncomplicated: Secondary | ICD-10-CM | POA: Diagnosis not present

## 2020-08-06 DIAGNOSIS — F1020 Alcohol dependence, uncomplicated: Secondary | ICD-10-CM | POA: Diagnosis not present

## 2020-08-07 DIAGNOSIS — F1020 Alcohol dependence, uncomplicated: Secondary | ICD-10-CM | POA: Diagnosis not present

## 2020-08-08 DIAGNOSIS — F1020 Alcohol dependence, uncomplicated: Secondary | ICD-10-CM | POA: Diagnosis not present

## 2020-08-09 DIAGNOSIS — F1020 Alcohol dependence, uncomplicated: Secondary | ICD-10-CM | POA: Diagnosis not present

## 2020-08-10 DIAGNOSIS — F1020 Alcohol dependence, uncomplicated: Secondary | ICD-10-CM | POA: Diagnosis not present

## 2020-08-11 DIAGNOSIS — F1020 Alcohol dependence, uncomplicated: Secondary | ICD-10-CM | POA: Diagnosis not present

## 2020-08-12 DIAGNOSIS — F1020 Alcohol dependence, uncomplicated: Secondary | ICD-10-CM | POA: Diagnosis not present

## 2020-08-13 DIAGNOSIS — F1020 Alcohol dependence, uncomplicated: Secondary | ICD-10-CM | POA: Diagnosis not present

## 2020-08-14 DIAGNOSIS — F1020 Alcohol dependence, uncomplicated: Secondary | ICD-10-CM | POA: Diagnosis not present

## 2020-08-15 DIAGNOSIS — F1020 Alcohol dependence, uncomplicated: Secondary | ICD-10-CM | POA: Diagnosis not present

## 2020-08-16 DIAGNOSIS — F1020 Alcohol dependence, uncomplicated: Secondary | ICD-10-CM | POA: Diagnosis not present

## 2020-08-17 DIAGNOSIS — F1020 Alcohol dependence, uncomplicated: Secondary | ICD-10-CM | POA: Diagnosis not present

## 2020-08-18 DIAGNOSIS — F1020 Alcohol dependence, uncomplicated: Secondary | ICD-10-CM | POA: Diagnosis not present

## 2020-08-19 DIAGNOSIS — F1020 Alcohol dependence, uncomplicated: Secondary | ICD-10-CM | POA: Diagnosis not present

## 2020-08-20 DIAGNOSIS — F1020 Alcohol dependence, uncomplicated: Secondary | ICD-10-CM | POA: Diagnosis not present

## 2020-08-21 DIAGNOSIS — F1020 Alcohol dependence, uncomplicated: Secondary | ICD-10-CM | POA: Diagnosis not present

## 2020-08-22 DIAGNOSIS — F1020 Alcohol dependence, uncomplicated: Secondary | ICD-10-CM | POA: Diagnosis not present

## 2020-08-23 DIAGNOSIS — F1020 Alcohol dependence, uncomplicated: Secondary | ICD-10-CM | POA: Diagnosis not present

## 2020-08-24 DIAGNOSIS — F1020 Alcohol dependence, uncomplicated: Secondary | ICD-10-CM | POA: Diagnosis not present

## 2020-08-25 DIAGNOSIS — F1020 Alcohol dependence, uncomplicated: Secondary | ICD-10-CM | POA: Diagnosis not present

## 2020-08-26 DIAGNOSIS — F1020 Alcohol dependence, uncomplicated: Secondary | ICD-10-CM | POA: Diagnosis not present

## 2020-08-27 DIAGNOSIS — F1020 Alcohol dependence, uncomplicated: Secondary | ICD-10-CM | POA: Diagnosis not present

## 2020-08-28 DIAGNOSIS — F1020 Alcohol dependence, uncomplicated: Secondary | ICD-10-CM | POA: Diagnosis not present

## 2020-08-29 DIAGNOSIS — F1020 Alcohol dependence, uncomplicated: Secondary | ICD-10-CM | POA: Diagnosis not present

## 2020-08-30 DIAGNOSIS — F1020 Alcohol dependence, uncomplicated: Secondary | ICD-10-CM | POA: Diagnosis not present

## 2020-08-31 DIAGNOSIS — F1020 Alcohol dependence, uncomplicated: Secondary | ICD-10-CM | POA: Diagnosis not present

## 2020-09-01 DIAGNOSIS — F1020 Alcohol dependence, uncomplicated: Secondary | ICD-10-CM | POA: Diagnosis not present

## 2020-09-02 DIAGNOSIS — F1020 Alcohol dependence, uncomplicated: Secondary | ICD-10-CM | POA: Diagnosis not present

## 2020-09-03 DIAGNOSIS — F1020 Alcohol dependence, uncomplicated: Secondary | ICD-10-CM | POA: Diagnosis not present

## 2020-09-04 DIAGNOSIS — F1020 Alcohol dependence, uncomplicated: Secondary | ICD-10-CM | POA: Diagnosis not present

## 2020-09-05 DIAGNOSIS — F1020 Alcohol dependence, uncomplicated: Secondary | ICD-10-CM | POA: Diagnosis not present

## 2020-09-06 DIAGNOSIS — F1020 Alcohol dependence, uncomplicated: Secondary | ICD-10-CM | POA: Diagnosis not present

## 2020-09-07 DIAGNOSIS — F1020 Alcohol dependence, uncomplicated: Secondary | ICD-10-CM | POA: Diagnosis not present

## 2020-09-08 DIAGNOSIS — F1020 Alcohol dependence, uncomplicated: Secondary | ICD-10-CM | POA: Diagnosis not present

## 2020-09-09 DIAGNOSIS — F1020 Alcohol dependence, uncomplicated: Secondary | ICD-10-CM | POA: Diagnosis not present

## 2020-09-13 DIAGNOSIS — F419 Anxiety disorder, unspecified: Secondary | ICD-10-CM | POA: Diagnosis not present

## 2020-09-13 DIAGNOSIS — F1020 Alcohol dependence, uncomplicated: Secondary | ICD-10-CM | POA: Diagnosis not present

## 2020-09-13 DIAGNOSIS — G479 Sleep disorder, unspecified: Secondary | ICD-10-CM | POA: Diagnosis not present

## 2020-09-14 DIAGNOSIS — G479 Sleep disorder, unspecified: Secondary | ICD-10-CM | POA: Diagnosis not present

## 2020-09-14 DIAGNOSIS — F1020 Alcohol dependence, uncomplicated: Secondary | ICD-10-CM | POA: Diagnosis not present

## 2020-09-14 DIAGNOSIS — F419 Anxiety disorder, unspecified: Secondary | ICD-10-CM | POA: Diagnosis not present

## 2020-09-15 DIAGNOSIS — F17200 Nicotine dependence, unspecified, uncomplicated: Secondary | ICD-10-CM | POA: Diagnosis not present

## 2020-09-15 DIAGNOSIS — F1020 Alcohol dependence, uncomplicated: Secondary | ICD-10-CM | POA: Diagnosis not present

## 2020-09-16 DIAGNOSIS — F1020 Alcohol dependence, uncomplicated: Secondary | ICD-10-CM | POA: Diagnosis not present

## 2020-09-22 DIAGNOSIS — F1020 Alcohol dependence, uncomplicated: Secondary | ICD-10-CM | POA: Diagnosis not present

## 2020-09-24 DIAGNOSIS — F1020 Alcohol dependence, uncomplicated: Secondary | ICD-10-CM | POA: Diagnosis not present

## 2020-09-24 DIAGNOSIS — Z5181 Encounter for therapeutic drug level monitoring: Secondary | ICD-10-CM | POA: Diagnosis not present

## 2020-09-25 DIAGNOSIS — F1020 Alcohol dependence, uncomplicated: Secondary | ICD-10-CM | POA: Diagnosis not present

## 2020-09-26 DIAGNOSIS — F1020 Alcohol dependence, uncomplicated: Secondary | ICD-10-CM | POA: Diagnosis not present

## 2020-09-27 DIAGNOSIS — F1020 Alcohol dependence, uncomplicated: Secondary | ICD-10-CM | POA: Diagnosis not present

## 2020-09-30 DIAGNOSIS — F1010 Alcohol abuse, uncomplicated: Secondary | ICD-10-CM | POA: Diagnosis not present

## 2020-10-03 DIAGNOSIS — F1020 Alcohol dependence, uncomplicated: Secondary | ICD-10-CM | POA: Diagnosis not present

## 2020-10-03 DIAGNOSIS — F17200 Nicotine dependence, unspecified, uncomplicated: Secondary | ICD-10-CM | POA: Diagnosis not present

## 2020-10-04 DIAGNOSIS — F17200 Nicotine dependence, unspecified, uncomplicated: Secondary | ICD-10-CM | POA: Diagnosis not present

## 2020-10-04 DIAGNOSIS — F1020 Alcohol dependence, uncomplicated: Secondary | ICD-10-CM | POA: Diagnosis not present

## 2020-10-06 DIAGNOSIS — F17200 Nicotine dependence, unspecified, uncomplicated: Secondary | ICD-10-CM | POA: Diagnosis not present

## 2020-10-06 DIAGNOSIS — F1020 Alcohol dependence, uncomplicated: Secondary | ICD-10-CM | POA: Diagnosis not present

## 2020-10-07 DIAGNOSIS — M25562 Pain in left knee: Secondary | ICD-10-CM | POA: Diagnosis not present

## 2020-10-10 DIAGNOSIS — F17200 Nicotine dependence, unspecified, uncomplicated: Secondary | ICD-10-CM | POA: Diagnosis not present

## 2020-10-10 DIAGNOSIS — F1020 Alcohol dependence, uncomplicated: Secondary | ICD-10-CM | POA: Diagnosis not present

## 2020-10-11 DIAGNOSIS — F1020 Alcohol dependence, uncomplicated: Secondary | ICD-10-CM | POA: Diagnosis not present

## 2020-10-11 DIAGNOSIS — F17200 Nicotine dependence, unspecified, uncomplicated: Secondary | ICD-10-CM | POA: Diagnosis not present

## 2020-10-13 DIAGNOSIS — F17200 Nicotine dependence, unspecified, uncomplicated: Secondary | ICD-10-CM | POA: Diagnosis not present

## 2020-10-13 DIAGNOSIS — F1020 Alcohol dependence, uncomplicated: Secondary | ICD-10-CM | POA: Diagnosis not present

## 2020-10-18 DIAGNOSIS — F17200 Nicotine dependence, unspecified, uncomplicated: Secondary | ICD-10-CM | POA: Diagnosis not present

## 2020-10-18 DIAGNOSIS — F1020 Alcohol dependence, uncomplicated: Secondary | ICD-10-CM | POA: Diagnosis not present

## 2020-10-20 DIAGNOSIS — F1020 Alcohol dependence, uncomplicated: Secondary | ICD-10-CM | POA: Diagnosis not present

## 2020-10-20 DIAGNOSIS — F17200 Nicotine dependence, unspecified, uncomplicated: Secondary | ICD-10-CM | POA: Diagnosis not present

## 2020-10-21 DIAGNOSIS — M25562 Pain in left knee: Secondary | ICD-10-CM | POA: Diagnosis not present

## 2020-10-24 DIAGNOSIS — F1020 Alcohol dependence, uncomplicated: Secondary | ICD-10-CM | POA: Diagnosis not present

## 2020-10-24 DIAGNOSIS — F17200 Nicotine dependence, unspecified, uncomplicated: Secondary | ICD-10-CM | POA: Diagnosis not present

## 2020-10-25 DIAGNOSIS — F17200 Nicotine dependence, unspecified, uncomplicated: Secondary | ICD-10-CM | POA: Diagnosis not present

## 2020-10-25 DIAGNOSIS — F1020 Alcohol dependence, uncomplicated: Secondary | ICD-10-CM | POA: Diagnosis not present

## 2020-10-26 DIAGNOSIS — F17200 Nicotine dependence, unspecified, uncomplicated: Secondary | ICD-10-CM | POA: Diagnosis not present

## 2020-10-26 DIAGNOSIS — F1020 Alcohol dependence, uncomplicated: Secondary | ICD-10-CM | POA: Diagnosis not present

## 2020-10-27 DIAGNOSIS — F17200 Nicotine dependence, unspecified, uncomplicated: Secondary | ICD-10-CM | POA: Diagnosis not present

## 2020-10-27 DIAGNOSIS — F1020 Alcohol dependence, uncomplicated: Secondary | ICD-10-CM | POA: Diagnosis not present

## 2020-10-31 DIAGNOSIS — F17200 Nicotine dependence, unspecified, uncomplicated: Secondary | ICD-10-CM | POA: Diagnosis not present

## 2020-10-31 DIAGNOSIS — F1020 Alcohol dependence, uncomplicated: Secondary | ICD-10-CM | POA: Diagnosis not present

## 2020-11-01 DIAGNOSIS — F17200 Nicotine dependence, unspecified, uncomplicated: Secondary | ICD-10-CM | POA: Diagnosis not present

## 2020-11-01 DIAGNOSIS — F1020 Alcohol dependence, uncomplicated: Secondary | ICD-10-CM | POA: Diagnosis not present

## 2020-11-02 DIAGNOSIS — F1020 Alcohol dependence, uncomplicated: Secondary | ICD-10-CM | POA: Diagnosis not present

## 2020-11-02 DIAGNOSIS — F17200 Nicotine dependence, unspecified, uncomplicated: Secondary | ICD-10-CM | POA: Diagnosis not present

## 2020-11-07 DIAGNOSIS — F17200 Nicotine dependence, unspecified, uncomplicated: Secondary | ICD-10-CM | POA: Diagnosis not present

## 2020-11-07 DIAGNOSIS — F1020 Alcohol dependence, uncomplicated: Secondary | ICD-10-CM | POA: Diagnosis not present

## 2020-11-08 DIAGNOSIS — F1020 Alcohol dependence, uncomplicated: Secondary | ICD-10-CM | POA: Diagnosis not present

## 2020-11-08 DIAGNOSIS — F17200 Nicotine dependence, unspecified, uncomplicated: Secondary | ICD-10-CM | POA: Diagnosis not present

## 2020-11-09 DIAGNOSIS — G479 Sleep disorder, unspecified: Secondary | ICD-10-CM | POA: Diagnosis not present

## 2020-11-09 DIAGNOSIS — F1020 Alcohol dependence, uncomplicated: Secondary | ICD-10-CM | POA: Diagnosis not present

## 2020-11-09 DIAGNOSIS — F419 Anxiety disorder, unspecified: Secondary | ICD-10-CM | POA: Diagnosis not present

## 2020-11-10 DIAGNOSIS — F17200 Nicotine dependence, unspecified, uncomplicated: Secondary | ICD-10-CM | POA: Diagnosis not present

## 2020-11-10 DIAGNOSIS — F1020 Alcohol dependence, uncomplicated: Secondary | ICD-10-CM | POA: Diagnosis not present

## 2020-11-14 DIAGNOSIS — F1020 Alcohol dependence, uncomplicated: Secondary | ICD-10-CM | POA: Diagnosis not present

## 2020-11-14 DIAGNOSIS — F17200 Nicotine dependence, unspecified, uncomplicated: Secondary | ICD-10-CM | POA: Diagnosis not present

## 2020-11-15 DIAGNOSIS — F17200 Nicotine dependence, unspecified, uncomplicated: Secondary | ICD-10-CM | POA: Diagnosis not present

## 2020-11-15 DIAGNOSIS — F1020 Alcohol dependence, uncomplicated: Secondary | ICD-10-CM | POA: Diagnosis not present

## 2020-11-16 DIAGNOSIS — F17200 Nicotine dependence, unspecified, uncomplicated: Secondary | ICD-10-CM | POA: Diagnosis not present

## 2020-11-16 DIAGNOSIS — F1020 Alcohol dependence, uncomplicated: Secondary | ICD-10-CM | POA: Diagnosis not present

## 2020-11-21 DIAGNOSIS — F1010 Alcohol abuse, uncomplicated: Secondary | ICD-10-CM | POA: Diagnosis not present

## 2020-11-21 DIAGNOSIS — F1020 Alcohol dependence, uncomplicated: Secondary | ICD-10-CM | POA: Diagnosis not present

## 2020-11-21 DIAGNOSIS — F17200 Nicotine dependence, unspecified, uncomplicated: Secondary | ICD-10-CM | POA: Diagnosis not present

## 2020-11-22 DIAGNOSIS — F419 Anxiety disorder, unspecified: Secondary | ICD-10-CM | POA: Diagnosis not present

## 2020-11-22 DIAGNOSIS — G479 Sleep disorder, unspecified: Secondary | ICD-10-CM | POA: Diagnosis not present

## 2020-11-22 DIAGNOSIS — F1020 Alcohol dependence, uncomplicated: Secondary | ICD-10-CM | POA: Diagnosis not present

## 2020-11-22 DIAGNOSIS — F17200 Nicotine dependence, unspecified, uncomplicated: Secondary | ICD-10-CM | POA: Diagnosis not present

## 2020-11-24 DIAGNOSIS — F17200 Nicotine dependence, unspecified, uncomplicated: Secondary | ICD-10-CM | POA: Diagnosis not present

## 2020-11-24 DIAGNOSIS — F1020 Alcohol dependence, uncomplicated: Secondary | ICD-10-CM | POA: Diagnosis not present

## 2020-11-29 DIAGNOSIS — F17200 Nicotine dependence, unspecified, uncomplicated: Secondary | ICD-10-CM | POA: Diagnosis not present

## 2020-11-29 DIAGNOSIS — F1020 Alcohol dependence, uncomplicated: Secondary | ICD-10-CM | POA: Diagnosis not present

## 2020-11-30 DIAGNOSIS — F17200 Nicotine dependence, unspecified, uncomplicated: Secondary | ICD-10-CM | POA: Diagnosis not present

## 2020-11-30 DIAGNOSIS — F1020 Alcohol dependence, uncomplicated: Secondary | ICD-10-CM | POA: Diagnosis not present

## 2020-12-05 DIAGNOSIS — F17200 Nicotine dependence, unspecified, uncomplicated: Secondary | ICD-10-CM | POA: Diagnosis not present

## 2020-12-05 DIAGNOSIS — F1020 Alcohol dependence, uncomplicated: Secondary | ICD-10-CM | POA: Diagnosis not present

## 2020-12-06 DIAGNOSIS — F1020 Alcohol dependence, uncomplicated: Secondary | ICD-10-CM | POA: Diagnosis not present

## 2020-12-06 DIAGNOSIS — G479 Sleep disorder, unspecified: Secondary | ICD-10-CM | POA: Diagnosis not present

## 2020-12-06 DIAGNOSIS — F419 Anxiety disorder, unspecified: Secondary | ICD-10-CM | POA: Diagnosis not present

## 2020-12-15 DIAGNOSIS — F1020 Alcohol dependence, uncomplicated: Secondary | ICD-10-CM | POA: Diagnosis not present

## 2020-12-15 DIAGNOSIS — F17200 Nicotine dependence, unspecified, uncomplicated: Secondary | ICD-10-CM | POA: Diagnosis not present

## 2020-12-20 DIAGNOSIS — F1020 Alcohol dependence, uncomplicated: Secondary | ICD-10-CM | POA: Diagnosis not present

## 2020-12-20 DIAGNOSIS — G479 Sleep disorder, unspecified: Secondary | ICD-10-CM | POA: Diagnosis not present

## 2020-12-20 DIAGNOSIS — F17200 Nicotine dependence, unspecified, uncomplicated: Secondary | ICD-10-CM | POA: Diagnosis not present

## 2020-12-20 DIAGNOSIS — F419 Anxiety disorder, unspecified: Secondary | ICD-10-CM | POA: Diagnosis not present

## 2020-12-21 DIAGNOSIS — F1020 Alcohol dependence, uncomplicated: Secondary | ICD-10-CM | POA: Diagnosis not present

## 2020-12-22 DIAGNOSIS — F1020 Alcohol dependence, uncomplicated: Secondary | ICD-10-CM | POA: Diagnosis not present

## 2020-12-22 DIAGNOSIS — F17200 Nicotine dependence, unspecified, uncomplicated: Secondary | ICD-10-CM | POA: Diagnosis not present

## 2020-12-27 DIAGNOSIS — F1020 Alcohol dependence, uncomplicated: Secondary | ICD-10-CM | POA: Diagnosis not present

## 2020-12-27 DIAGNOSIS — F17200 Nicotine dependence, unspecified, uncomplicated: Secondary | ICD-10-CM | POA: Diagnosis not present

## 2020-12-30 DIAGNOSIS — F1020 Alcohol dependence, uncomplicated: Secondary | ICD-10-CM | POA: Diagnosis not present

## 2020-12-30 DIAGNOSIS — F419 Anxiety disorder, unspecified: Secondary | ICD-10-CM | POA: Diagnosis not present

## 2020-12-30 DIAGNOSIS — G479 Sleep disorder, unspecified: Secondary | ICD-10-CM | POA: Diagnosis not present

## 2021-01-03 DIAGNOSIS — F17200 Nicotine dependence, unspecified, uncomplicated: Secondary | ICD-10-CM | POA: Diagnosis not present

## 2021-01-03 DIAGNOSIS — F1020 Alcohol dependence, uncomplicated: Secondary | ICD-10-CM | POA: Diagnosis not present

## 2021-03-23 NOTE — Patient Outreach (Signed)
[facility] Customer service manager [facility]) Care Management ? ?03/23/2021 ? ?Gerri Spore Oddo ?1970-04-30 ?[id number] ? ? ?Received referral for of screening/assessments for Complex needs from Insurance plan. Assigned patient to [employer], LCSW for follow up. ? ? ?Vanice Sarah ?[facility] Care Management Assistant ?[phone] ? ?

## 2021-03-29 ENCOUNTER — Other Ambulatory Visit: Payer: BLUE CROSS/BLUE SHIELD | Admitting: *Deleted

## 2021-03-29 ENCOUNTER — Encounter: Payer: Self-pay | Admitting: *Deleted

## 2021-03-29 NOTE — Patient Outreach (Signed)
Care Management ?Clinical Social Work Note ? ?03/29/2021 ?Name: Robert Norton MRN: [id number] DOB: 1970/01/25 ? ?Robert Norton is a 51 y.o. year old male who is a primary care Robert Norton of Robert Norton, No Pcp Per (Inactive).  The Care Management team was consulted for assistance with chronic disease management and coordination needs. ? ?Engaged with Robert Norton by telephone for initial visit in response to provider referral for social work chronic care management and care coordination services.  Robert Norton denies the need for social work involvement at this time.  Robert Norton did not wish to receive resources, or a referral for ETOH use/abuse, or mental health counseling and supportive services for psychoses. ? ?Consent to Services:  ?Robert Norton was given information about Care Management services today including:  ?Care Management services includes personalized support from designated clinical staff supervised by his physician, including individualized plan of care and coordination with other care providers ?24/7 contact phone numbers for assistance for urgent and routine care needs. ?The Robert Norton may stop case management services at any time by phone call to the office staff. ? ?Robert Norton agreed to services and consent obtained.  ? ?Assessment: Review of Robert Norton past medical history, allergies, medications, and health status, including review of relevant consultants reports was performed today as part of a comprehensive evaluation and provision of chronic care management and care coordination services. ? ?SDOH (Social Determinants of Health) assessments and interventions performed:  ?SDOH Interventions   ? ?Flowsheet Row Most Recent Value  ?SDOH Interventions   ?Food Insecurity Interventions Intervention Not Indicated  ?Financial Strain Interventions Intervention Not Indicated  ?Housing Interventions Intervention Not Indicated  ?Intimate Partner Violence Interventions Intervention Not Indicated  ?Physical Activity Interventions  Robert Norton Refused  ?Stress Interventions Intervention Not Indicated  ?Social Connections Interventions Intervention Not Indicated  ?Transportation Interventions Intervention Not Indicated  ? ?  ?  ? ?Advanced Directives Status: Not ready or willing to discuss. ? ?Care Plan ? ?Allergies  ?Allergen Reactions  ? Penicillins Hives and Rash  ? ? ?Outpatient Encounter Medications as of 03/29/2021  ?Medication Sig  ? cloNIDine (CATAPRES) 0.1 MG tablet Take 0.1 mg by mouth 3 (three) times daily.   ? Disulfiram 500 MG TABS Take 1 tablet by mouth daily.  ? escitalopram (LEXAPRO) 5 MG tablet Take 5 mg by mouth daily.  ? gabapentin (NEURONTIN) 300 MG capsule Take 300 mg by mouth in the morning and at bedtime.  ? hydrOXYzine (ATARAX/VISTARIL) 25 MG tablet Take 25 mg by mouth 4 (four) times daily.  ? naltrexone (DEPADE) 50 MG tablet Take 50 mg by mouth daily.  ? ?No facility-administered encounter medications on file as of 03/29/2021.  ? ? ?Robert Norton Active Problem List  ? Diagnosis Date Noted  ? Nonpurulent cellulitis of right arm 10/25/2019  ? Alcohol use disorder, moderate, in early remission, dependence (HCC) 10/25/2019  ? ? ?Conditions to be addressed/monitored: Alcohol Use Disorder, Moderate, in Early Remission, Dependence.  Mental Health Concerns, Substance Abuse Issues, and Lacks Knowledge of [facility]. ? ?No Follow-Up Required. ?

## 2021-04-06 ENCOUNTER — Ambulatory Visit: Payer: BLUE CROSS/BLUE SHIELD | Admitting: *Deleted

## 2022-09-02 IMAGING — CR DG ELBOW COMPLETE 3+V*R*
4 series · 4 of 4 positions shown · non-contrast
Comparison: None.

CLINICAL DATA: Swelling laterally

EXAM:
RIGHT ELBOW - COMPLETE 3+ VIEW

[elbow ap]
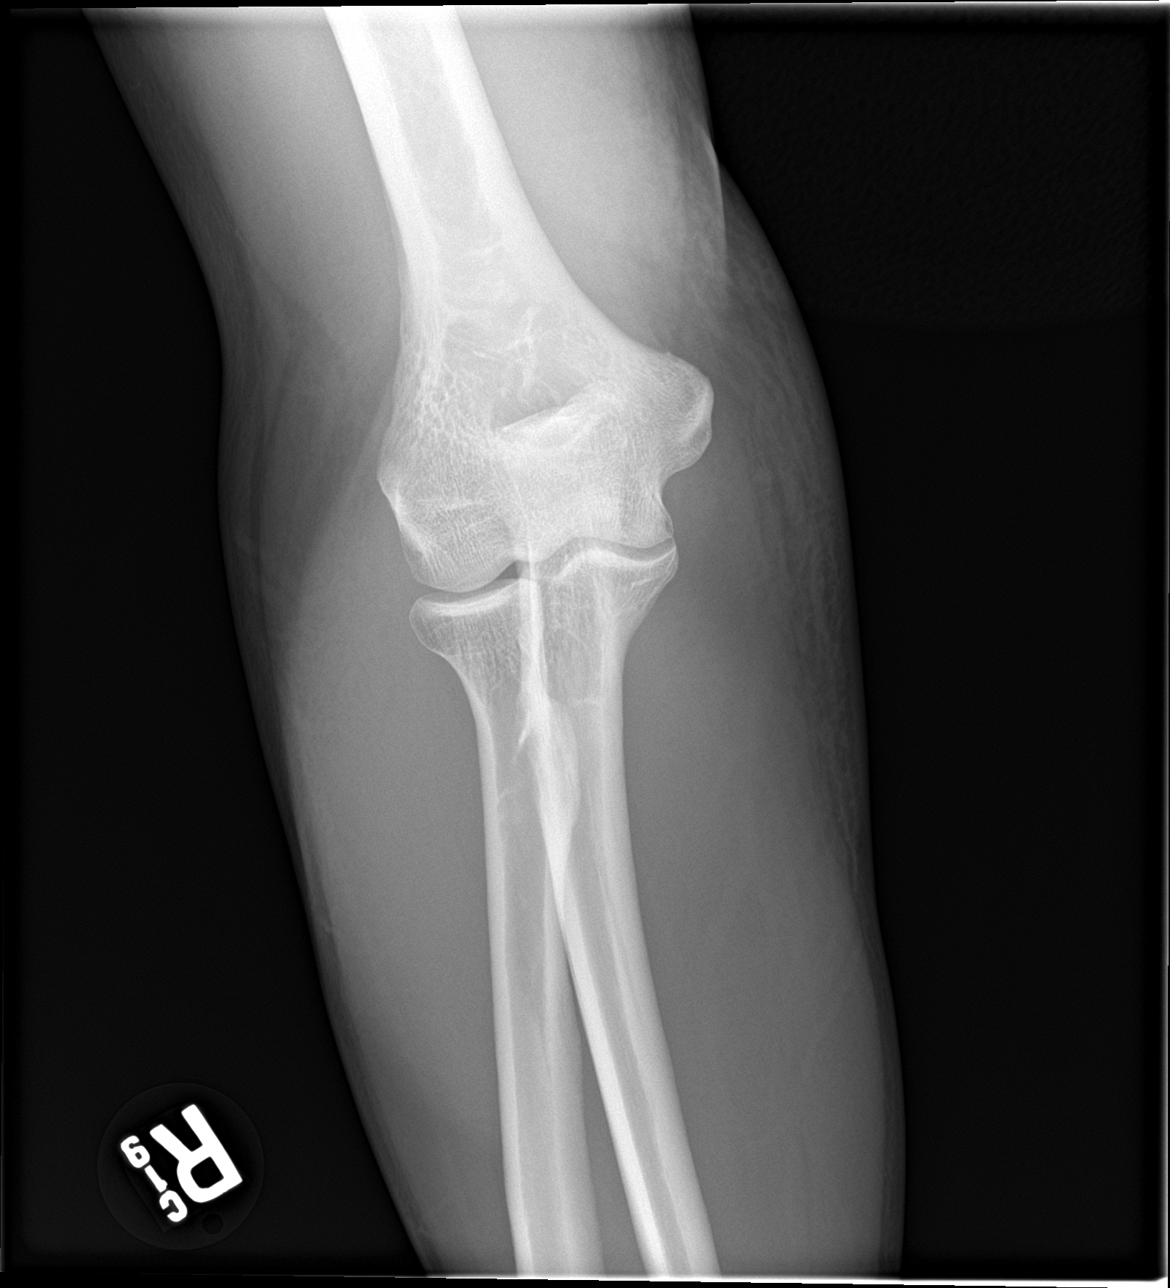

[elbow obl (1 of 2)]
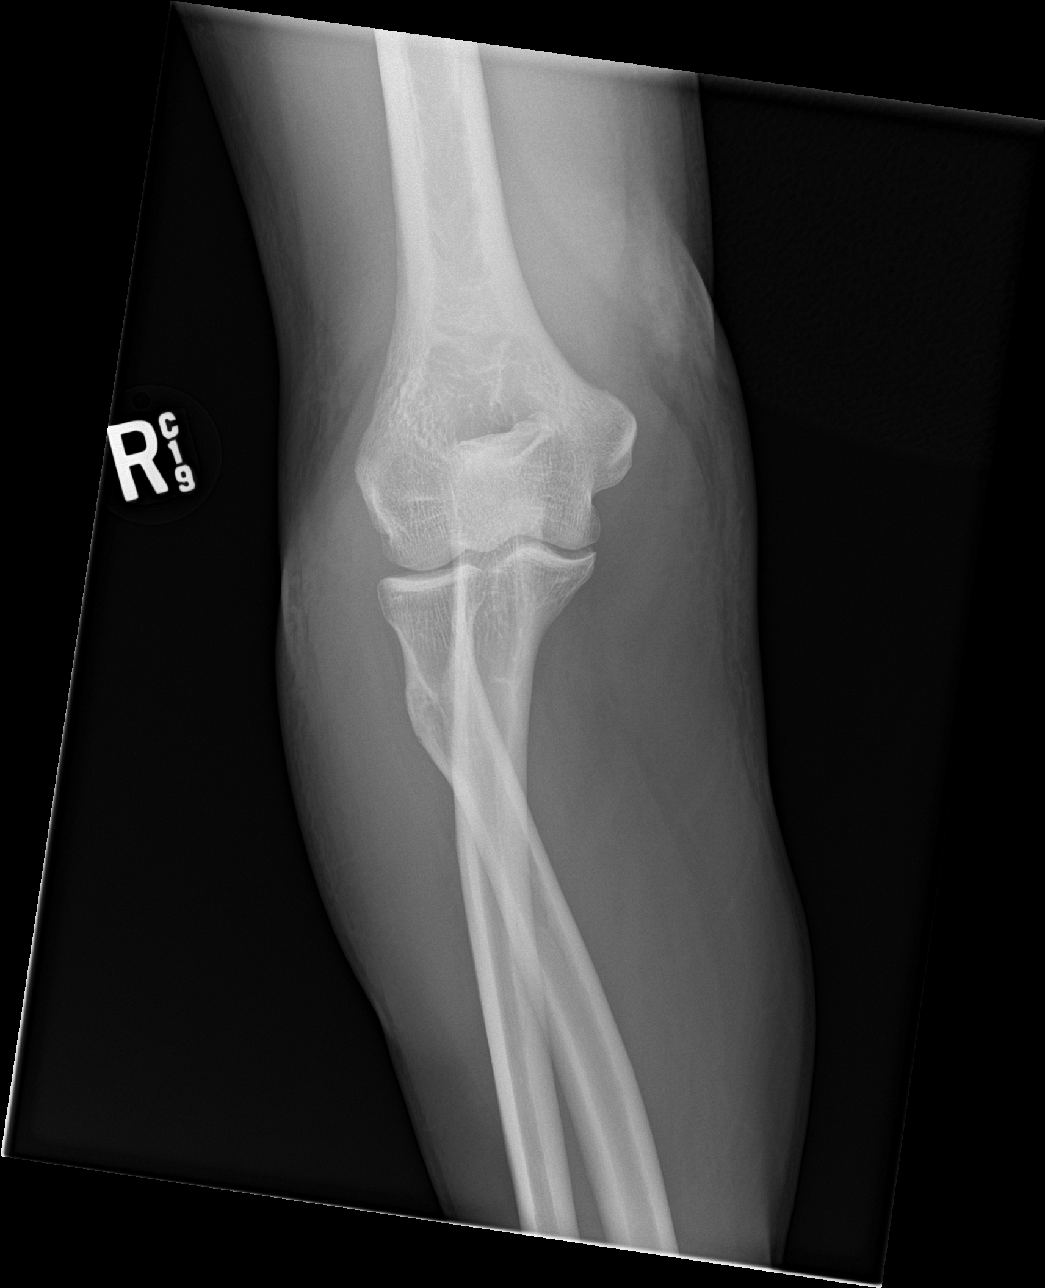

[elbow obl (2 of 2)]
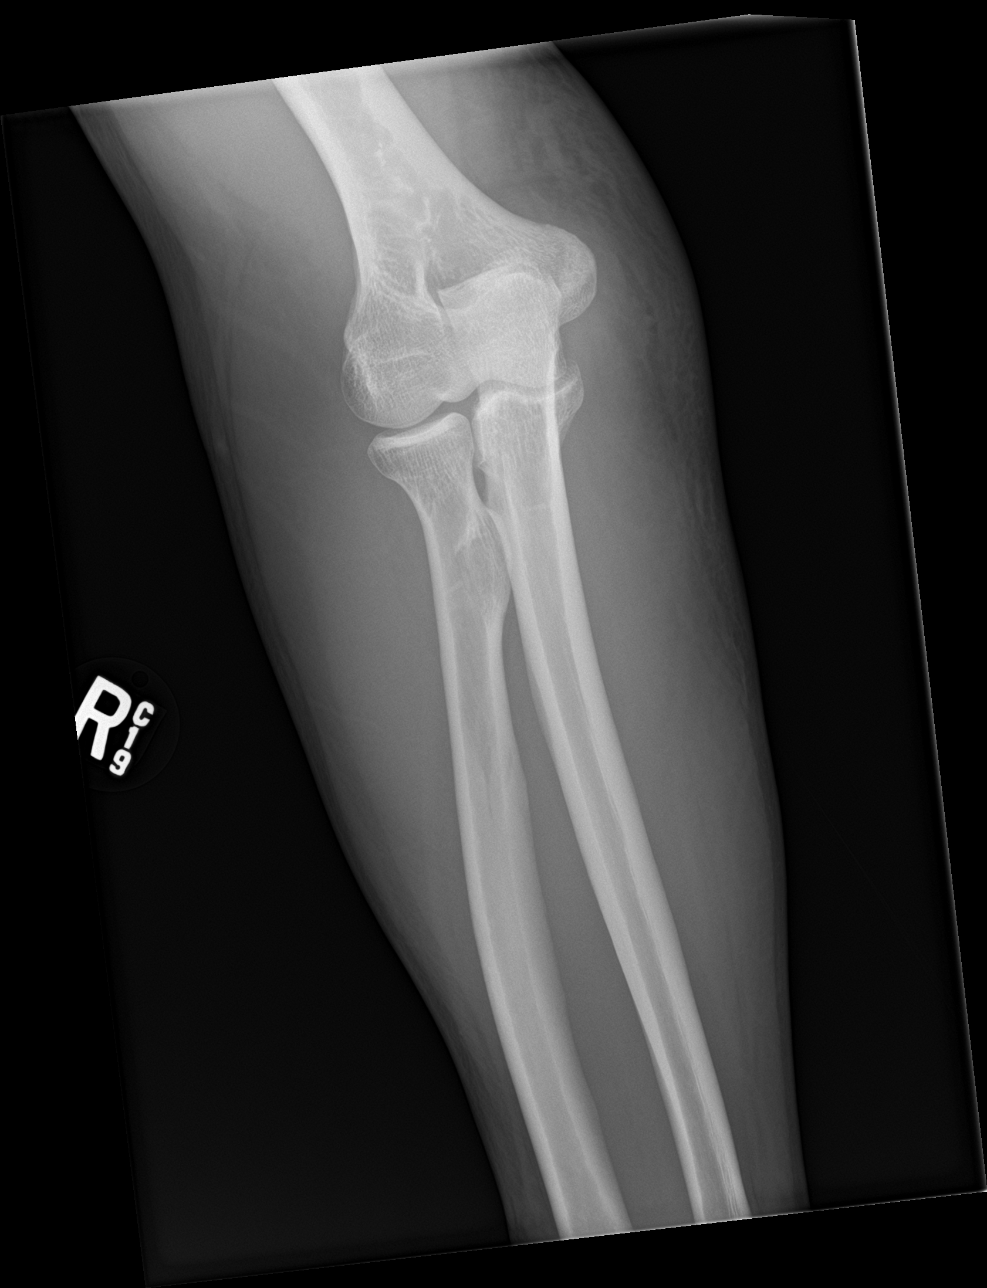

[elbow lat]
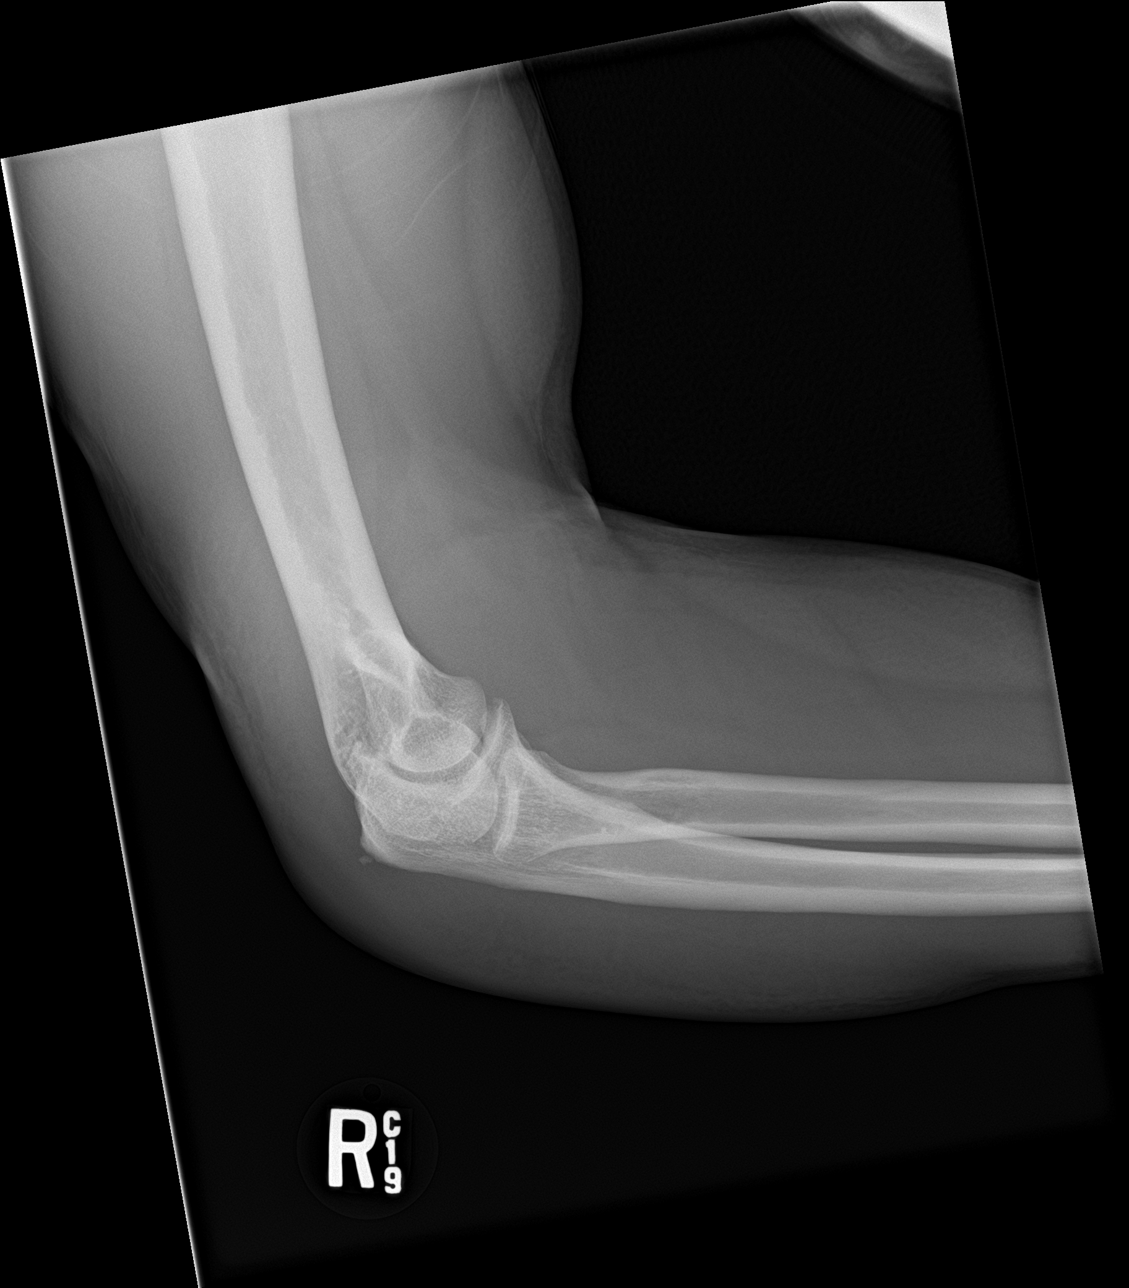

[4 of 4 positions shown; findings below may reference images not displayed]

FINDINGS: Diffuse soft tissue swelling. No acute bony abnormality.
Specifically, no fracture, subluxation, or dislocation. No joint
effusion.
IMPRESSION: Diffuse soft tissue swelling.  No acute bony abnormality.

## 2022-09-02 IMAGING — DX DG CHEST 1V PORT
1 series · 1 of 1 positions shown · non-contrast
Comparison: CT Abdomen and Pelvis 01/04/2009.

CLINICAL DATA: 48-year-old male with possible sepsis.

EXAM:
PORTABLE CHEST 1 VIEW

[chest]
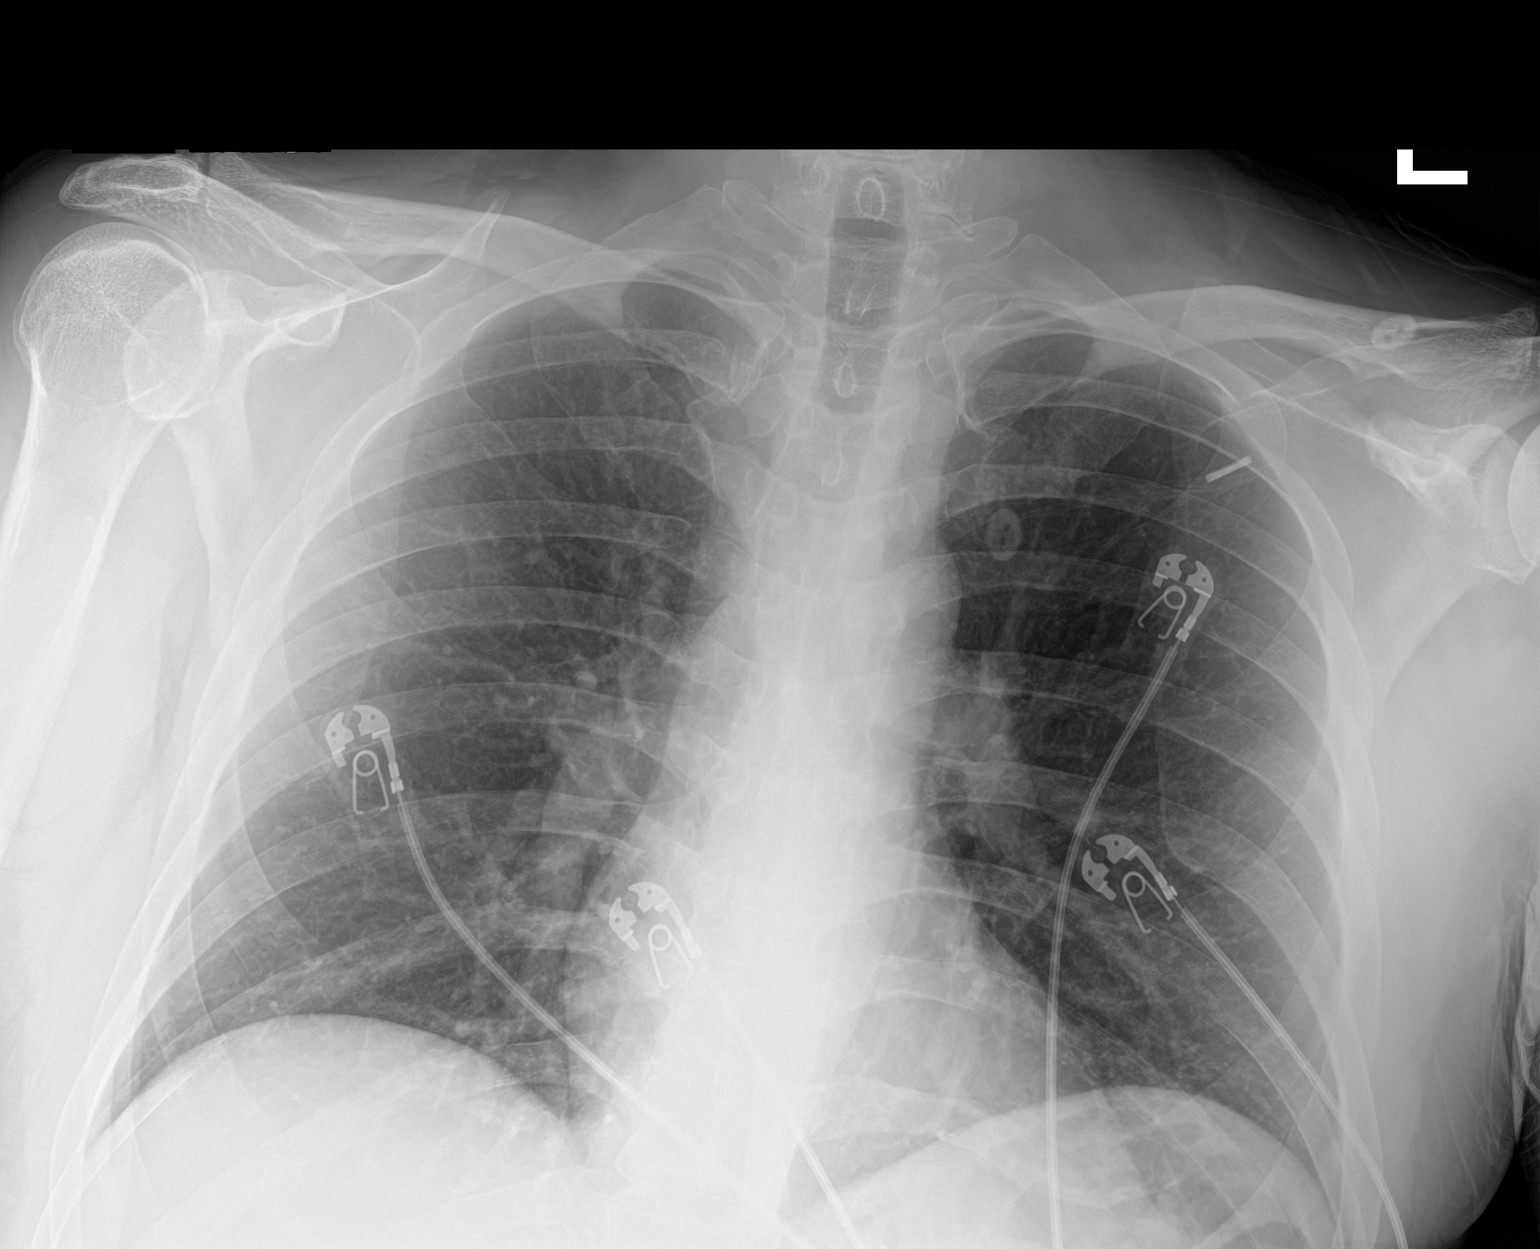

[1 of 1 positions shown; findings below may reference images not displayed]

FINDINGS: Portable AP semi upright view at 9189 hours. Linear radiopaque
object projecting along the peripheral left upper lung may be
external. There is also clothing button appearing artifact near the
superior left mediastinum.

Allowing for portable technique the lungs are clear. No pneumothorax
or pleural effusion. Somewhat low lung volumes. Normal cardiac size
and mediastinal contours. Visualized tracheal air column is within
normal limits. No acute osseous abnormality identified.
IMPRESSION: 1. Negative portable chest.
2. Presumed external (possibly clothing) artifact projecting over
the left upper lung.

## 2022-09-20 IMAGING — DX DG CHEST 2V
2 series · 2 of 2 positions shown · non-contrast
Comparison: Portable chest 10/25/2019.

CLINICAL DATA: 48-year-old male with recurrent elbow pain and
swelling.

EXAM:
CHEST - 2 VIEW

[chest pa]
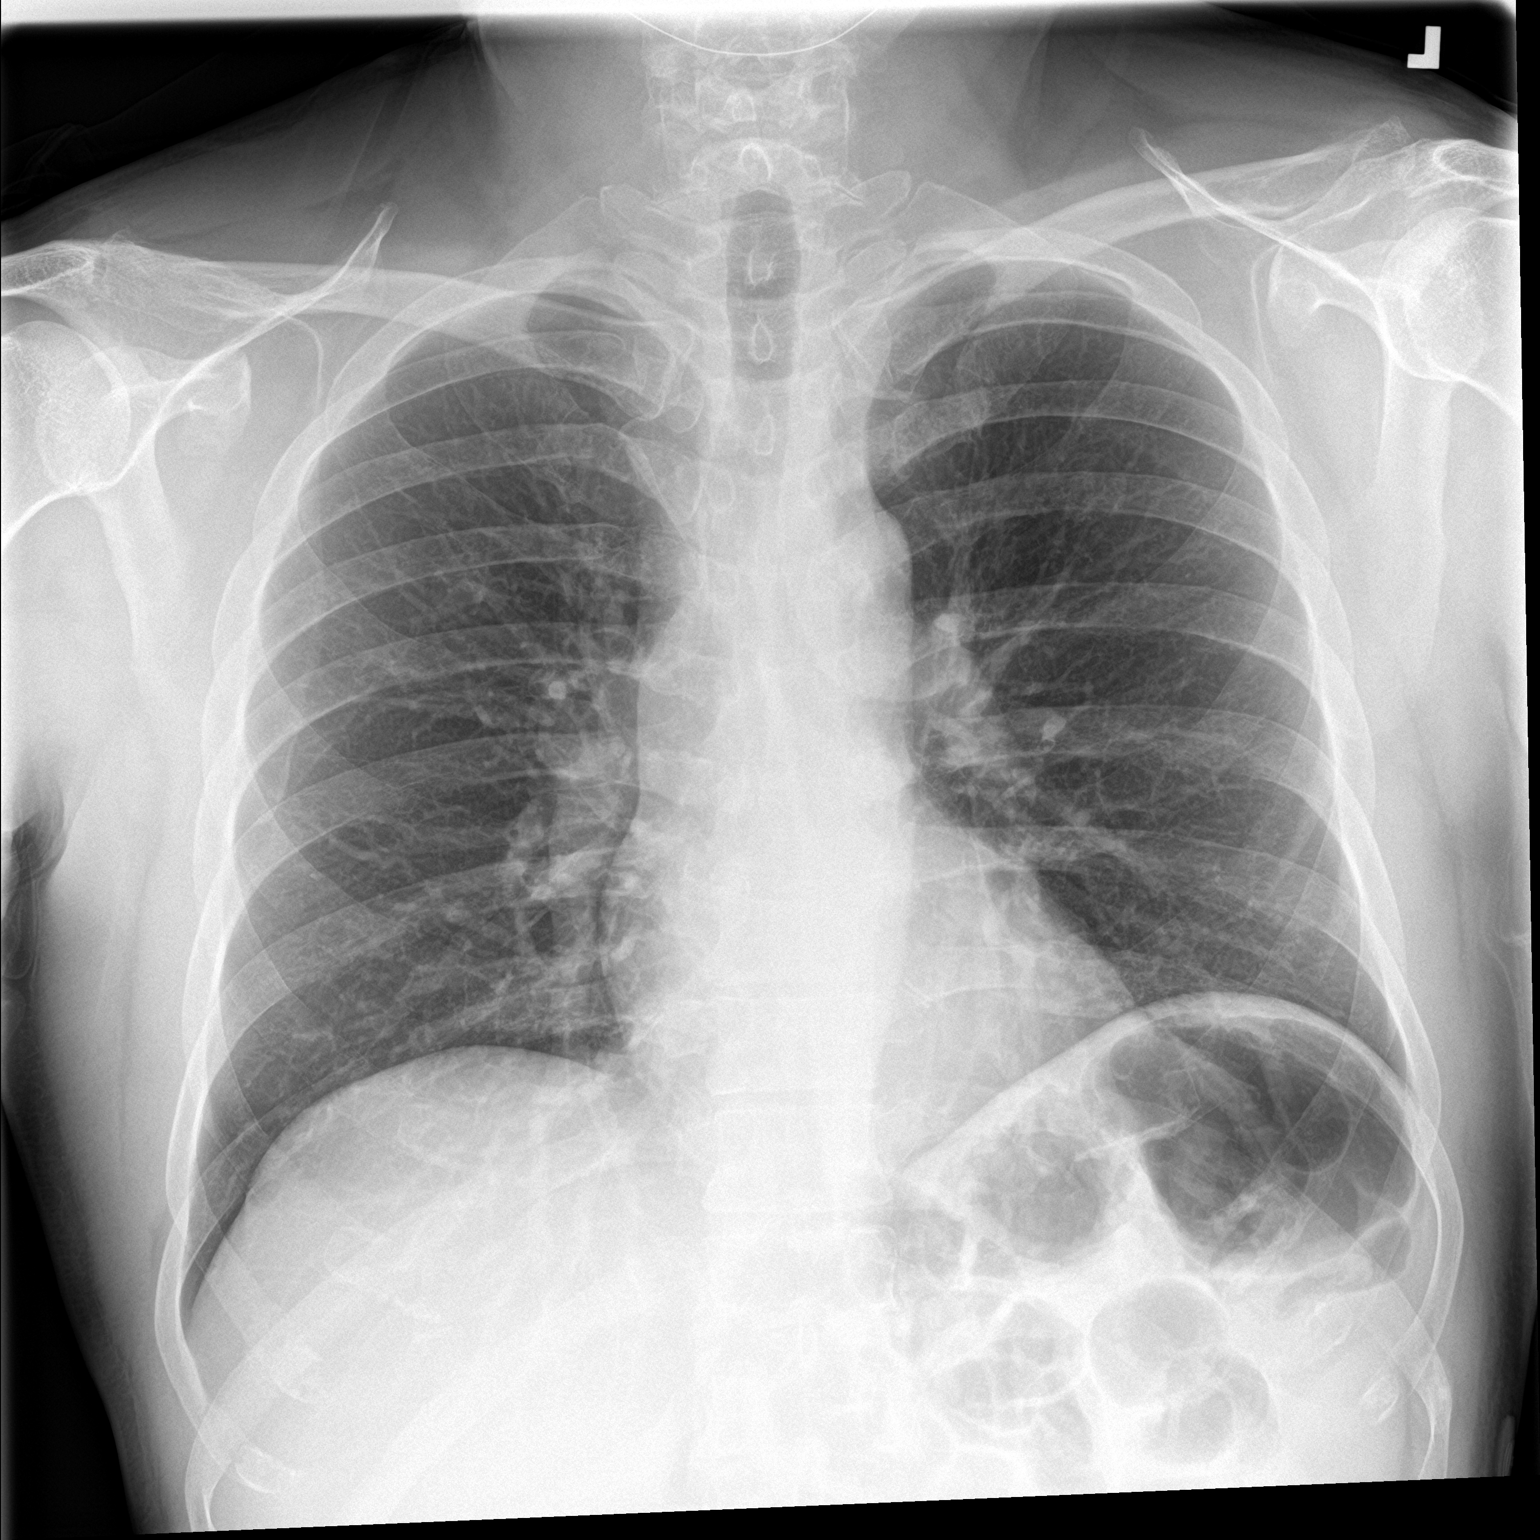

[chest lat]
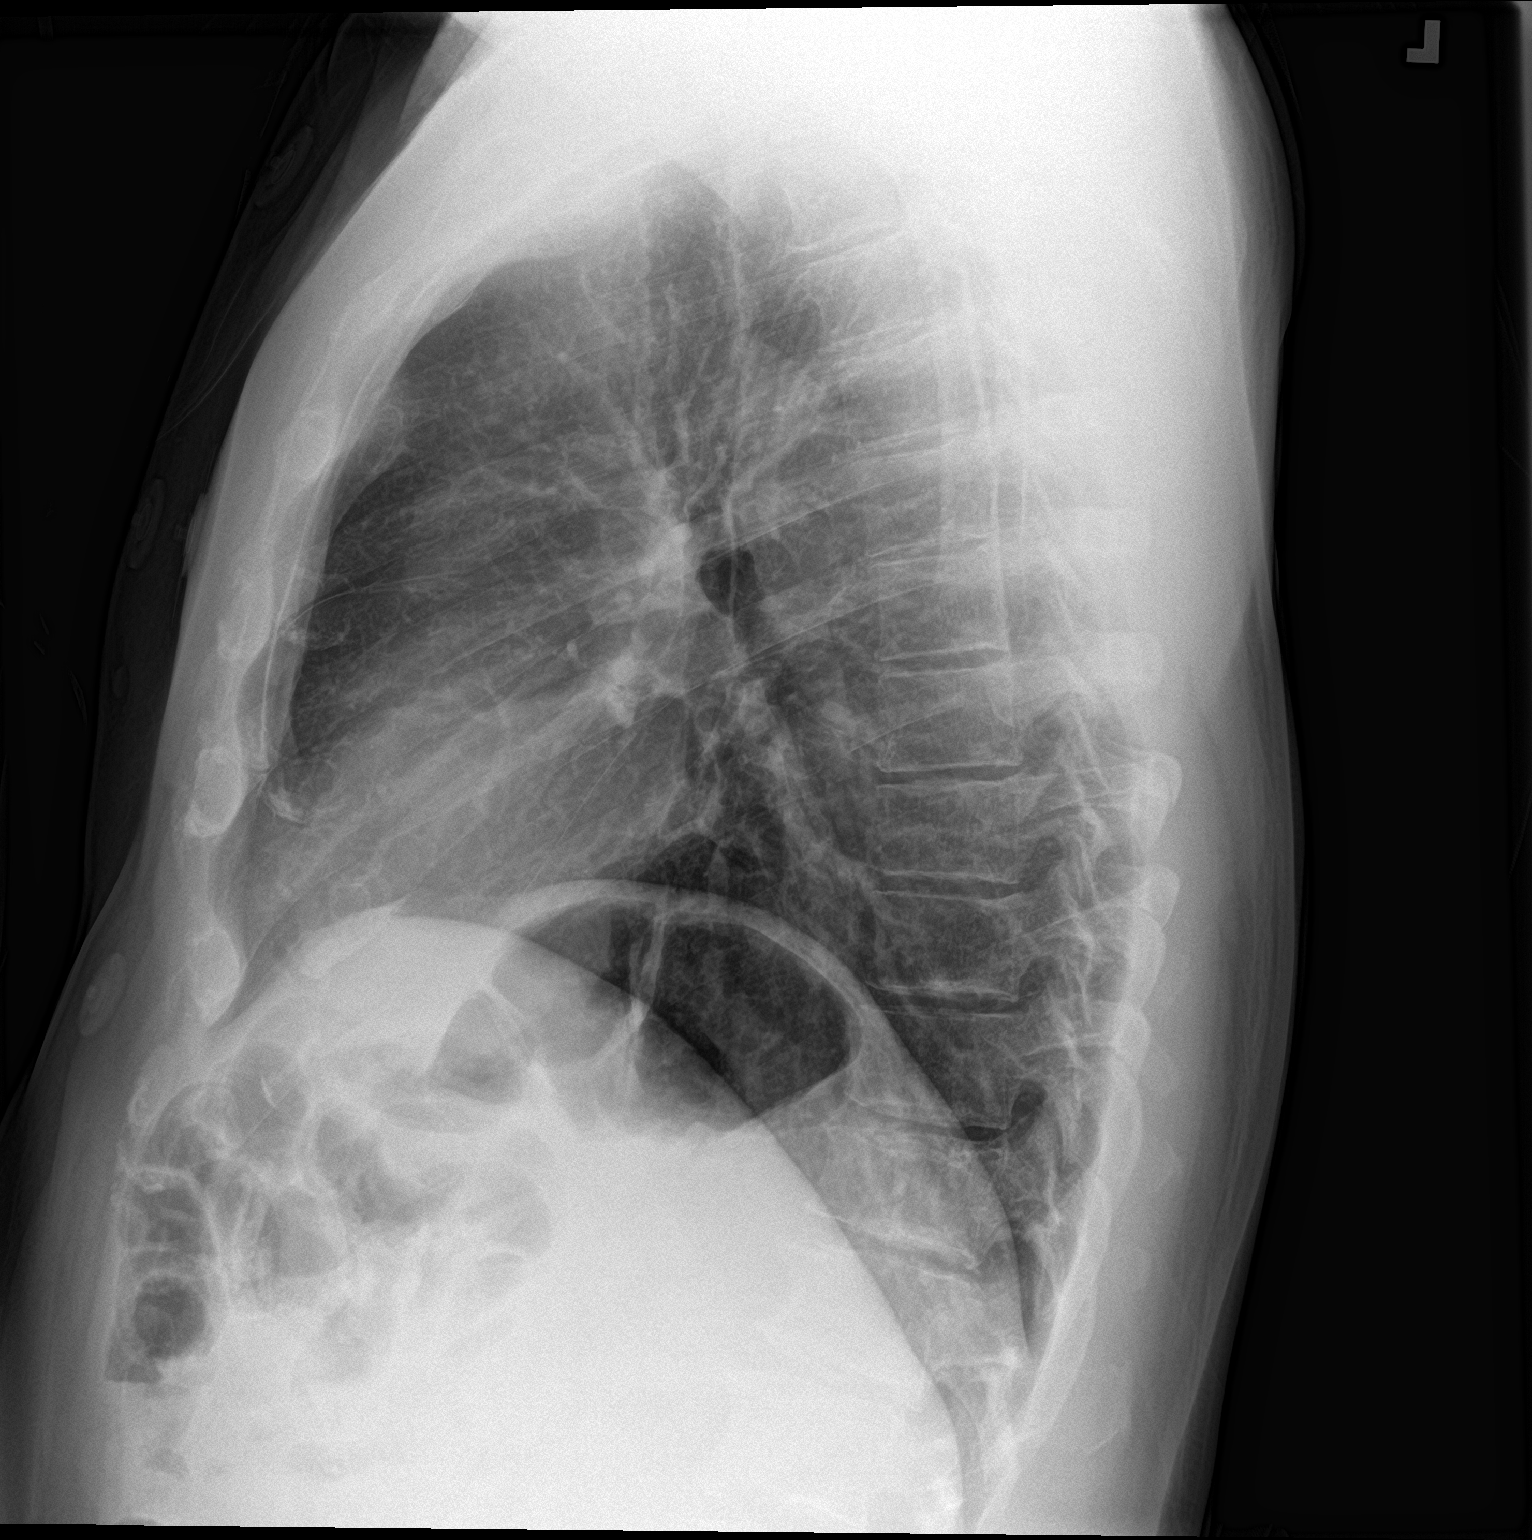

[2 of 2 positions shown; findings below may reference images not displayed]

FINDINGS: PA and lateral views today. Lung volumes and mediastinal contours
remain normal. Visualized tracheal air column is within normal
limits. Both lungs appear clear. No pneumothorax or pleural
effusion.

No acute osseous abnormality identified. Negative visible bowel gas
pattern.
IMPRESSION: Negative.  No acute cardiopulmonary abnormality.
